# Patient Record
Sex: Female | Born: 1960 | Race: White | Hispanic: No | Marital: Single | Smoking: Never smoker
Health system: Southern US, Community
[De-identification: ages and names within clinical notes are randomized; demographics above are authoritative.]

## PROBLEM LIST (undated history)

## (undated) DIAGNOSIS — B019 Varicella without complication: Secondary | ICD-10-CM

## (undated) DIAGNOSIS — K219 Gastro-esophageal reflux disease without esophagitis: Secondary | ICD-10-CM

## (undated) DIAGNOSIS — Z87442 Personal history of urinary calculi: Secondary | ICD-10-CM

## (undated) DIAGNOSIS — N200 Calculus of kidney: Secondary | ICD-10-CM

## (undated) HISTORY — DX: Gastro-esophageal reflux disease without esophagitis: K21.9

## (undated) HISTORY — DX: Calculus of kidney: N20.0

## (undated) HISTORY — DX: Varicella without complication: B01.9

## (undated) HISTORY — DX: Personal history of urinary calculi: Z87.442

---

## 1999-07-27 ENCOUNTER — Other Ambulatory Visit: Admission: RE | Admit: 1999-07-27 | Discharge: 1999-07-27 | Payer: Self-pay | Admitting: *Deleted

## 2000-08-24 ENCOUNTER — Other Ambulatory Visit: Admission: RE | Admit: 2000-08-24 | Discharge: 2000-08-24 | Payer: Self-pay | Admitting: Family Medicine

## 2000-11-14 ENCOUNTER — Encounter: Admission: RE | Admit: 2000-11-14 | Discharge: 2000-11-14 | Payer: Self-pay | Admitting: Family Medicine

## 2000-11-14 ENCOUNTER — Encounter: Payer: Self-pay | Admitting: Family Medicine

## 2001-04-15 ENCOUNTER — Encounter: Payer: Self-pay | Admitting: Gastroenterology

## 2001-04-15 ENCOUNTER — Encounter: Admission: RE | Admit: 2001-04-15 | Discharge: 2001-04-15 | Payer: Self-pay | Admitting: Gastroenterology

## 2001-07-16 ENCOUNTER — Other Ambulatory Visit: Admission: RE | Admit: 2001-07-16 | Discharge: 2001-07-16 | Payer: Self-pay | Admitting: Family Medicine

## 2002-04-21 ENCOUNTER — Ambulatory Visit: Admission: RE | Admit: 2002-04-21 | Discharge: 2002-04-21 | Payer: Self-pay | Admitting: Family Medicine

## 2002-10-15 ENCOUNTER — Other Ambulatory Visit: Admission: RE | Admit: 2002-10-15 | Discharge: 2002-10-15 | Payer: Self-pay | Admitting: Family Medicine

## 2004-09-01 ENCOUNTER — Encounter: Admission: RE | Admit: 2004-09-01 | Discharge: 2004-09-01 | Payer: Self-pay | Admitting: Family Medicine

## 2004-12-23 ENCOUNTER — Encounter: Admission: RE | Admit: 2004-12-23 | Discharge: 2004-12-23 | Payer: Self-pay | Admitting: Family Medicine

## 2005-02-07 ENCOUNTER — Other Ambulatory Visit: Admission: RE | Admit: 2005-02-07 | Discharge: 2005-02-07 | Payer: Self-pay | Admitting: Obstetrics and Gynecology

## 2005-02-21 ENCOUNTER — Ambulatory Visit (HOSPITAL_COMMUNITY): Admission: RE | Admit: 2005-02-21 | Discharge: 2005-02-21 | Payer: Self-pay | Admitting: Obstetrics and Gynecology

## 2005-02-22 ENCOUNTER — Ambulatory Visit (HOSPITAL_COMMUNITY): Admission: RE | Admit: 2005-02-22 | Discharge: 2005-02-22 | Payer: Self-pay | Admitting: Obstetrics and Gynecology

## 2007-01-29 ENCOUNTER — Other Ambulatory Visit: Admission: RE | Admit: 2007-01-29 | Discharge: 2007-01-29 | Payer: Self-pay | Admitting: Obstetrics and Gynecology

## 2007-04-04 ENCOUNTER — Emergency Department (HOSPITAL_COMMUNITY): Admission: EM | Admit: 2007-04-04 | Discharge: 2007-04-04 | Payer: Self-pay | Admitting: Emergency Medicine

## 2007-11-06 ENCOUNTER — Encounter: Admission: RE | Admit: 2007-11-06 | Discharge: 2007-11-06 | Payer: Self-pay | Admitting: Obstetrics and Gynecology

## 2008-09-11 ENCOUNTER — Encounter: Admission: RE | Admit: 2008-09-11 | Discharge: 2008-09-11 | Payer: Self-pay | Admitting: Obstetrics and Gynecology

## 2008-12-25 HISTORY — PX: OTHER SURGICAL HISTORY: SHX169

## 2008-12-25 HISTORY — PX: ABDOMINAL HYSTERECTOMY: SHX81

## 2012-07-15 ENCOUNTER — Other Ambulatory Visit: Payer: Self-pay | Admitting: Family Medicine

## 2012-07-15 ENCOUNTER — Ambulatory Visit
Admission: RE | Admit: 2012-07-15 | Discharge: 2012-07-15 | Disposition: A | Discharge status: Home / Self Care | Payer: 59 | Source: Ambulatory Visit | Attending: Family Medicine | Admitting: Family Medicine

## 2012-07-15 DIAGNOSIS — R7611 Nonspecific reaction to tuberculin skin test without active tuberculosis: Secondary | ICD-10-CM

## 2013-01-02 ENCOUNTER — Other Ambulatory Visit: Payer: Self-pay | Admitting: Family Medicine

## 2013-01-02 DIAGNOSIS — Z1231 Encounter for screening mammogram for malignant neoplasm of breast: Secondary | ICD-10-CM

## 2013-02-04 ENCOUNTER — Ambulatory Visit
Admission: RE | Admit: 2013-02-04 | Discharge: 2013-02-04 | Disposition: A | Discharge status: Home / Self Care | Payer: BC Managed Care – PPO | Source: Ambulatory Visit | Attending: Family Medicine | Admitting: Family Medicine

## 2013-02-04 DIAGNOSIS — Z1231 Encounter for screening mammogram for malignant neoplasm of breast: Secondary | ICD-10-CM

## 2013-02-05 ENCOUNTER — Ambulatory Visit: Payer: 59

## 2014-02-27 ENCOUNTER — Ambulatory Visit (INDEPENDENT_AMBULATORY_CARE_PROVIDER_SITE_OTHER): Payer: BC Managed Care – PPO | Admitting: Internal Medicine

## 2014-02-27 ENCOUNTER — Encounter: Payer: Self-pay | Admitting: Internal Medicine

## 2014-02-27 VITALS — BP 110/76 | HR 60 | Temp 98.0°F | Ht 65.0 in | Wt 156.0 lb

## 2014-02-27 DIAGNOSIS — R635 Abnormal weight gain: Secondary | ICD-10-CM

## 2014-02-27 DIAGNOSIS — R5383 Other fatigue: Secondary | ICD-10-CM

## 2014-02-27 DIAGNOSIS — E663 Overweight: Secondary | ICD-10-CM

## 2014-02-27 DIAGNOSIS — R5381 Other malaise: Secondary | ICD-10-CM

## 2014-02-27 LAB — COMPREHENSIVE METABOLIC PANEL
ALK PHOS: 63 U/L (ref 39–117)
ALT: 21 U/L (ref 0–35)
AST: 26 U/L (ref 0–37)
Albumin: 4.4 g/dL (ref 3.5–5.2)
BILIRUBIN TOTAL: 0.7 mg/dL (ref 0.3–1.2)
BUN: 8 mg/dL (ref 6–23)
CHLORIDE: 104 meq/L (ref 96–112)
CO2: 28 mEq/L (ref 19–32)
Calcium: 10 mg/dL (ref 8.4–10.5)
Creatinine, Ser: 0.7 mg/dL (ref 0.4–1.2)
GFR: 93.5 mL/min (ref >60.00)
Glucose, Bld: 82 mg/dL (ref 70–99)
Potassium: 3.8 mEq/L (ref 3.5–5.1)
SODIUM: 141 meq/L (ref 135–145)
TOTAL PROTEIN: 7.6 g/dL (ref 6.0–8.3)

## 2014-02-27 LAB — CBC
HCT: 41.5 % (ref 36.0–46.0)
HEMOGLOBIN: 13.8 g/dL (ref 12.0–15.0)
MCHC: 33.3 g/dL (ref 30.0–36.0)
MCV: 88.8 fl (ref 78.0–100.0)
PLATELETS: 224 10*3/uL (ref 150.0–400.0)
RBC: 4.67 Mil/uL (ref 3.87–5.11)
RDW: 12.8 % (ref 11.5–14.6)
WBC: 5.9 10*3/uL (ref 4.5–10.5)

## 2014-02-27 LAB — LIPID PANEL
CHOL/HDL RATIO: 3
Cholesterol: 212 mg/dL — ABNORMAL HIGH (ref 0–200)
HDL: 62.5 mg/dL (ref >39.00)
LDL Cholesterol: 134 mg/dL — ABNORMAL HIGH (ref 0–99)
TRIGLYCERIDES: 80 mg/dL (ref 0.0–149.0)
VLDL: 16 mg/dL (ref 0.0–40.0)

## 2014-02-27 LAB — TSH: TSH: 0.93 u[IU]/mL (ref 0.35–5.50)

## 2014-02-27 NOTE — Assessment & Plan Note (Signed)
Will check thyroid If normal, will start phentermine

## 2014-02-27 NOTE — Assessment & Plan Note (Signed)
Labs obtained today

## 2014-02-27 NOTE — Progress Notes (Signed)
Pre visit review using our clinic review tool, if applicable. No additional management support is needed unless otherwise documented below in the visit note. 

## 2014-02-27 NOTE — Patient Instructions (Addendum)

## 2014-02-27 NOTE — Progress Notes (Signed)
HPI Pt presents to the clinic today to establish care. She is transferring care from [place]. She does have multiple concerns today.   Weight: gained 20 lb in last 2.5 years due to menopause. She does exercise (cardio)  multiple times per week. Her diet consist of steel oats with blueberries, salad for lunch, chicken and veggies.  She does c/o a throbbing pain in her left lower quadrant. She reports it started about 3-4 months. It does not hurt with intercourse. She is post menopausal. She does have a history of ovarian cysts. Her last pap smear was 11/2009. She has had a hysterectomy in 2010.  She does c/o pain in her left hips. She has noticed this within the last 2-3. The pain is worse with sitting. She has noticed some mild swelling in her left leg. She does take Aleve OTC if she is in severe pain. She denies any specific injury that she is aware. She does have some numbness or tingling in her left leg.  Past Medical History  Diagnosis Date  . Chicken pox   . GERD (gastroesophageal reflux disease)   . History of kidney stones     Current Outpatient Prescriptions  Medication Sig Dispense Refill  . Multiple Vitamin (MULTIVITAMIN) tablet Take 1 tablet by mouth daily.       No current facility-administered medications for this visit.    Allergies  Allergen Reactions  . Sulfa Antibiotics Nausea Only  . Penicillins Rash    Family History  Problem Relation Age of Onset  . Heart disease Father   . Heart disease Maternal Grandfather   . Bone cancer Paternal Grandmother     History   Social History  . Marital Status: Single    Spouse Name: N/A    Number of Children: N/A  . Years of Education: N/A   Occupational History  . Not on file.   Social History Main Topics  . Smoking status: Never Smoker   . Smokeless tobacco: Never Used  . Alcohol Use: Yes     Comment: occasional  . Drug Use: No  . Sexual Activity: Not on file   Other Topics Concern  . Not on file   Social  History Narrative  . No narrative on file    ROS:  Constitutional: Pt reports fatigue and weight gain. Denies fever, malaise, headache.  Respiratory: Denies difficulty breathing, shortness of breath, cough or sputum production.   Cardiovascular: Denies chest pain, chest tightness, palpitations or swelling in the hands or feet.  Gastrointestinal: Pt reports LLQ pain. Denies bloating, constipation, diarrhea or blood in the stool.  GU: Denies frequency, urgency, pain with urination, blood in urine, odor or discharge. Musculoskeletal: Pt reports left hip pain. Denies decrease in range of motion, difficulty with gait, muscle pain or joint swelling.    No other specific complaints in a complete review of systems (except as listed in HPI above).  PE:  BP 110/76  Pulse 60  Temp(Src) 98 F (36.7 C) (Oral)  Ht 5\' 5"  (1.651 m)  Wt 156 lb (70.761 kg)  BMI 25.96 kg/m2  SpO2 99% Wt Readings from Last 3 Encounters:  02/27/14 156 lb (70.761 kg)    General: Appears her stated age, well developed, well nourished in NAD. Cardiovascular: Normal rate and rhythm. S1,S2 noted.  No murmur, rubs or gallops noted. No JVD or BLE edema. No carotid bruits noted. Pulmonary/Chest: Normal effort and positive vesicular breath sounds. No respiratory distress. No wheezes, rales or ronchi noted.  Abdomen: Soft and tender in the LLQ (more pelvic region). Normal bowel sounds, no bruits noted. No distention or masses noted. Liver, spleen and kidneys non palpable. Musculoskeletal: Normal internal/external rotation of the left hip. No crepitus noted with ROM. Strength 5/5 BUE.    Assessment and Plan:  Left Hip pain:  Exam normal today She declines xray  She prefers to go to chiropractor for xray and treatment  LLQ pain:  ? Ovarian cyst Will perform pelvic at your physical exam in 1 week Will likely need to obtain ultrasound  RTC in the next 2 weeks for your physical exam

## 2014-02-28 LAB — T3: T3, Total: 91.9 ng/dL (ref 80.0–204.0)

## 2014-02-28 LAB — T4: T4 TOTAL: 8 ug/dL (ref 5.0–12.5)

## 2014-03-03 ENCOUNTER — Ambulatory Visit (INDEPENDENT_AMBULATORY_CARE_PROVIDER_SITE_OTHER): Payer: BC Managed Care – PPO | Admitting: Internal Medicine

## 2014-03-03 ENCOUNTER — Encounter: Payer: Self-pay | Admitting: Internal Medicine

## 2014-03-03 VITALS — BP 118/78 | HR 74 | Temp 98.4°F | Ht 65.0 in | Wt 156.0 lb

## 2014-03-03 DIAGNOSIS — Z1211 Encounter for screening for malignant neoplasm of colon: Secondary | ICD-10-CM

## 2014-03-03 DIAGNOSIS — E039 Hypothyroidism, unspecified: Secondary | ICD-10-CM | POA: Insufficient documentation

## 2014-03-03 DIAGNOSIS — R102 Pelvic and perineal pain: Secondary | ICD-10-CM

## 2014-03-03 DIAGNOSIS — E038 Other specified hypothyroidism: Secondary | ICD-10-CM

## 2014-03-03 DIAGNOSIS — Z0000 Encounter for general adult medical examination without abnormal findings: Secondary | ICD-10-CM

## 2014-03-03 MED ORDER — LEVOTHYROXINE SODIUM 25 MCG PO TABS: 25.0000 ug | ORAL_TABLET | Freq: Every day | ORAL | Status: DC

## 2014-03-03 NOTE — Addendum Note (Signed)
Addended by: Alvina ChouWALSH, Azie Mcconahy J on: 03/03/2014 10:49 AM   Modules accepted: Orders

## 2014-03-03 NOTE — Progress Notes (Signed)
Pre visit review using our clinic review tool, if applicable. No additional management support is needed unless otherwise documented below in the visit note. 

## 2014-03-03 NOTE — Progress Notes (Signed)
Subjective:    Patient ID: Joanna Peterson, female    DOB: July 03, 1961, 53 y.o.   MRN: [id number]  HPI  Pt presents to the clinic today for her annual exam. She does have concerns about her weight. We tested her thyroid at her last visit which was normal. She has gained 20 lb in last 2.5 years due to menopause. She does exercise (cardio)  multiple times per week. Her diet consist of steel oats with blueberries, salad for lunch, chicken and veggies.  She does c/o a throbbing pain in her left lower quadrant. She reports it started about 3-4 months. It does not hurt with intercourse. She is post menopausal. She does have a history of ovarian cysts. Her last pap smear was 11/2009. She has had a hysterectomy in 2010.  Flu: never Tetanus: within the last 5 years Pap Smear: 2010 Mammogram: 2014 Colon Screening: never Eye Doctor: yearly Dentist: biannually   Review of Systems      Past Medical History  Diagnosis Date  . Chicken pox   . GERD (gastroesophageal reflux disease)   . History of kidney stones     Current Outpatient Prescriptions  Medication Sig Dispense Refill  . Multiple Vitamin (MULTIVITAMIN) tablet Take 1 tablet by mouth daily.       No current facility-administered medications for this visit.    Allergies  Allergen Reactions  . Sulfa Antibiotics Nausea Only  . Penicillins Rash    Family History  Problem Relation Age of Onset  . Heart disease Father   . Heart disease Maternal Grandfather   . Bone cancer Paternal Grandmother     History   Social History  . Marital Status: Single    Spouse Name: N/A    Number of Children: N/A  . Years of Education: N/A   Occupational History  . Not on file.   Social History Main Topics  . Smoking status: Never Smoker   . Smokeless tobacco: Never Used  . Alcohol Use: Yes     Comment: occasional  . Drug Use: No  . Sexual Activity: Yes   Other Topics Concern  . Not on file   Social History Narrative  . No  narrative on file     Constitutional: Pt reports weight gain and fatigue. Denies fever, malaise, headache.  HEENT: Denies eye pain, eye redness, ear pain, ringing in the ears, wax buildup, runny nose, nasal congestion, bloody nose, or sore throat. Respiratory: Denies difficulty breathing, shortness of breath, cough or sputum production.   Cardiovascular: Denies chest pain, chest tightness, palpitations or swelling in the hands or feet.  Gastrointestinal: Pt reports left lower quadrant pain. Denies abdominal pain, bloating, constipation, diarrhea or blood in the stool.  GU: Denies urgency, frequency, pain with urination, burning sensation, blood in urine, odor or discharge. Musculoskeletal: Denies decrease in range of motion, difficulty with gait, muscle pain or joint pain and swelling.  Skin: Denies redness, rashes, lesions or ulcercations.  Neurological: Denies dizziness, difficulty with memory, difficulty with speech or problems with balance and coordination.   No other specific complaints in a complete review of systems (except as listed in HPI above).  Objective:   Physical Exam  BP 118/78  Pulse 74  Temp(Src) 98.4 F (36.9 C) (Oral)  Ht 5\' 5"  (1.651 m)  Wt 156 lb (70.761 kg)  BMI 25.96 kg/m2  SpO2 98%  Constitutional:  Alert, oriented x 4, well developed, well nourished in no apparent distress. Skin: Skin is warm and  dry.  No erythema, lesion or ulceration noted. HEENT: Head: normal shape and size; Eyes: sclera white, no icterus, conjunctiva pink, PERRLA and EOMs intact; Ears: Tm's gray and intact, normal light reflex; Nose: mucosa pink and moist, septum midline; Throat/Mouth: Teeth present, , mucosa pink and moist, no lesions or ulcerations noted. Neck: Normal range of motion. Neck supple, trachea midline. No massses, lumps or thyromegaly present.  Cardiovascular: Normal rate and rhythm. S1,S2 noted.  No murmur, rubs or gallops noted. No JVD or BLE edema. No carotid bruits  noted. Pulmonary/Chest: Normal effort and positive vesicular breath sounds. No respiratory distress. No wheezes, rales or ronchi noted.  Abdomen: Soft and nontender. Normal bowel sounds, no bruits noted. No distention or masses noted. Liver, spleen and kidneys non palpable. Genitourinary: Normal female anatomy. Adenexa palpable on the left. Breast without lumps or masses.  Musculoskeletal: Normal range of motion. Patient exhibits no effusions.  Neurological: Alert and oriented. Cranial nerves II-XII intact. Coordination normal. +DTRs bilaterally. Psychiatric: She has a normal mood and affect. Behavior is normal. Judgment and thought content normal.       Assessment & Plan:   Preventative Health Care:  She has her mammogram scheduled in April She declines flu Her Tetanus is UTD We will do IFOB for colon screening Pelvic exam today Labs reviewed- normal

## 2014-03-03 NOTE — Assessment & Plan Note (Signed)
Levels are on low side of normal Will start synthroid 25 mcg daily  RTC in 4 weeks to recheck TSH

## 2014-03-03 NOTE — Patient Instructions (Addendum)

## 2014-03-03 NOTE — Assessment & Plan Note (Signed)
Will order ultrasound to r/o left ovarian cyst

## 2014-03-05 ENCOUNTER — Ambulatory Visit
Admission: RE | Admit: 2014-03-05 | Discharge: 2014-03-05 | Disposition: A | Discharge status: Home / Self Care | Payer: BC Managed Care – PPO | Source: Ambulatory Visit | Attending: Internal Medicine | Admitting: Internal Medicine

## 2014-03-05 ENCOUNTER — Other Ambulatory Visit (INDEPENDENT_AMBULATORY_CARE_PROVIDER_SITE_OTHER): Payer: BC Managed Care – PPO

## 2014-03-05 DIAGNOSIS — R102 Pelvic and perineal pain: Secondary | ICD-10-CM

## 2014-03-05 DIAGNOSIS — Z1211 Encounter for screening for malignant neoplasm of colon: Secondary | ICD-10-CM

## 2014-03-05 LAB — FECAL OCCULT BLOOD, IMMUNOCHEMICAL: FECAL OCCULT BLD: NEGATIVE

## 2014-04-01 ENCOUNTER — Other Ambulatory Visit (INDEPENDENT_AMBULATORY_CARE_PROVIDER_SITE_OTHER): Payer: BC Managed Care – PPO

## 2014-04-01 ENCOUNTER — Other Ambulatory Visit: Payer: Self-pay | Admitting: Internal Medicine

## 2014-04-01 DIAGNOSIS — E039 Hypothyroidism, unspecified: Secondary | ICD-10-CM

## 2014-04-01 DIAGNOSIS — E038 Other specified hypothyroidism: Secondary | ICD-10-CM

## 2014-04-01 LAB — TSH: TSH: 1.23 u[IU]/mL (ref 0.35–5.50)

## 2014-04-03 MED ORDER — LEVOTHYROXINE SODIUM 25 MCG PO TABS: 25.0000 ug | ORAL_TABLET | Freq: Every day | ORAL | Status: DC

## 2014-04-03 NOTE — Addendum Note (Signed)
Addended by: Roena MaladyEVONTENNO, Shirely Toren Y on: 04/03/2014 11:53 AM   Modules accepted: Orders

## 2015-04-05 ENCOUNTER — Other Ambulatory Visit: Payer: Self-pay | Admitting: Chiropractic Medicine

## 2015-04-05 ENCOUNTER — Ambulatory Visit
Admission: RE | Admit: 2015-04-05 | Discharge: 2015-04-05 | Disposition: A | Discharge status: Home / Self Care | Payer: BLUE CROSS/BLUE SHIELD | Source: Ambulatory Visit | Attending: Chiropractic Medicine | Admitting: Chiropractic Medicine

## 2015-04-05 DIAGNOSIS — M25552 Pain in left hip: Secondary | ICD-10-CM

## 2015-05-06 ENCOUNTER — Other Ambulatory Visit: Payer: Self-pay

## 2015-05-06 DIAGNOSIS — Z1231 Encounter for screening mammogram for malignant neoplasm of breast: Secondary | ICD-10-CM

## 2015-06-02 ENCOUNTER — Ambulatory Visit: Payer: BLUE CROSS/BLUE SHIELD

## 2015-08-12 ENCOUNTER — Other Ambulatory Visit: Payer: Self-pay | Admitting: Internal Medicine

## 2015-08-12 DIAGNOSIS — Z0000 Encounter for general adult medical examination without abnormal findings: Secondary | ICD-10-CM

## 2015-08-12 MED ORDER — AZITHROMYCIN 250 MG PO TABS: ORAL_TABLET | ORAL | Status: DC

## 2015-08-17 ENCOUNTER — Other Ambulatory Visit (INDEPENDENT_AMBULATORY_CARE_PROVIDER_SITE_OTHER): Payer: 59

## 2015-08-17 DIAGNOSIS — Z0000 Encounter for general adult medical examination without abnormal findings: Secondary | ICD-10-CM

## 2015-08-17 LAB — CBC
HCT: 40.7 % (ref 36.0–46.0)
Hemoglobin: 13.8 g/dL (ref 12.0–15.0)
MCHC: 34 g/dL (ref 30.0–36.0)
MCV: 87.9 fl (ref 78.0–100.0)
PLATELETS: 223 10*3/uL (ref 150.0–400.0)
RBC: 4.63 Mil/uL (ref 3.87–5.11)
RDW: 12.6 % (ref 11.5–15.5)
WBC: 5.3 10*3/uL (ref 4.0–10.5)

## 2015-08-17 LAB — COMPREHENSIVE METABOLIC PANEL
ALT: 14 U/L (ref 0–35)
AST: 18 U/L (ref 0–37)
Albumin: 4.1 g/dL (ref 3.5–5.2)
Alkaline Phosphatase: 55 U/L (ref 39–117)
BUN: 15 mg/dL (ref 6–23)
CALCIUM: 9.5 mg/dL (ref 8.4–10.5)
CHLORIDE: 103 meq/L (ref 96–112)
CO2: 31 meq/L (ref 19–32)
CREATININE: 0.68 mg/dL (ref 0.40–1.20)
GFR: 96.14 mL/min (ref >60.00)
Glucose, Bld: 94 mg/dL (ref 70–99)
Potassium: 4.1 mEq/L (ref 3.5–5.1)
SODIUM: 141 meq/L (ref 135–145)
Total Bilirubin: 0.5 mg/dL (ref 0.2–1.2)
Total Protein: 7 g/dL (ref 6.0–8.3)

## 2015-08-17 LAB — LIPID PANEL
Cholesterol: 221 mg/dL — ABNORMAL HIGH (ref 0–200)
HDL: 66.5 mg/dL (ref >39.00)
LDL Cholesterol: 135 mg/dL — ABNORMAL HIGH (ref 0–99)
NONHDL: 154.24
Total CHOL/HDL Ratio: 3
Triglycerides: 95 mg/dL (ref 0.0–149.0)
VLDL: 19 mg/dL (ref 0.0–40.0)

## 2015-08-17 LAB — T4, FREE: FREE T4: 0.8 ng/dL (ref 0.60–1.60)

## 2015-08-17 LAB — TSH: TSH: 1.97 u[IU]/mL (ref 0.35–4.50)

## 2015-08-23 ENCOUNTER — Encounter: Payer: Self-pay | Admitting: Internal Medicine

## 2015-08-23 ENCOUNTER — Ambulatory Visit (INDEPENDENT_AMBULATORY_CARE_PROVIDER_SITE_OTHER): Payer: 59 | Admitting: Internal Medicine

## 2015-08-23 VITALS — BP 106/70 | HR 68 | Temp 97.9°F | Ht 65.0 in | Wt 153.0 lb

## 2015-08-23 DIAGNOSIS — Z0000 Encounter for general adult medical examination without abnormal findings: Secondary | ICD-10-CM

## 2015-08-23 NOTE — Patient Instructions (Signed)

## 2015-08-23 NOTE — Progress Notes (Signed)
Pre visit review using our clinic review tool, if applicable. No additional management support is needed unless otherwise documented below in the visit note. 

## 2015-08-23 NOTE — Progress Notes (Signed)
Subjective:    Patient ID: Joanna Peterson, female    DOB: 13-Aug-1961, 54 y.o.   MRN: [id number]  HPI  Pt presents to the clinic today for her annual exam.  Flu: 10/ Tetanus: 2007 Pap Smear: Hysterectomy (6-7 years ago). Pelvic done 2015. Mammogram: 2014. [facility] Colon Screening: never, did do IFOB yearly Vision Screening: 04/2015- yearly Dentist: biannually  Diet: She eats a balanced diet. She consumes fruits and veggies daily. Exercise: She does bootcamp, Pilates, 3-4 days per week.  Review of Systems      Past Medical History  Diagnosis Date  . Chicken pox   . GERD (gastroesophageal reflux disease)   . History of kidney stones     Current Outpatient Prescriptions  Medication Sig Dispense Refill  . levothyroxine (SYNTHROID, LEVOTHROID) 25 MCG tablet Take 1 tablet (25 mcg total) by mouth daily before breakfast. 30 tablet 5  . Multiple Vitamin (MULTIVITAMIN) tablet Take 1 tablet by mouth daily.     No current facility-administered medications for this visit.    Allergies  Allergen Reactions  . Sulfa Antibiotics Nausea Only  . Penicillins Rash    Family History  Problem Relation Age of Onset  . Heart disease Father   . Heart disease Maternal Grandfather   . Bone cancer Paternal Grandmother     Social History   Social History  . Marital Status: Single    Spouse Name: N/A  . Number of Children: N/A  . Years of Education: N/A   Occupational History  . Not on file.   Social History Main Topics  . Smoking status: Never Smoker   . Smokeless tobacco: Never Used  . Alcohol Use: Yes     Comment: occasional  . Drug Use: No  . Sexual Activity: Yes   Other Topics Concern  . Not on file   Social History Narrative     Constitutional: Denies fever, malaise, fatigue, headache or abrupt weight changes.  HEENT: Denies eye pain, eye redness, ear pain, ringing in the ears, wax buildup, runny nose, nasal  congestion, bloody nose, or sore throat. Respiratory: Denies difficulty breathing, shortness of breath, cough or sputum production.   Cardiovascular: Denies chest pain, chest tightness, palpitations or swelling in the hands or feet.  Gastrointestinal: Denies abdominal pain, bloating, constipation, diarrhea or blood in the stool.  GU: Denies urgency, frequency, pain with urination, burning sensation, blood in urine, odor or discharge. Musculoskeletal: Denies decrease in range of motion, difficulty with gait, muscle pain or joint pain and swelling.  Skin: Pt reports a mole on the left side of her neck. Denies redness, rashes, or ulcercations.  Neurological: Denies dizziness, difficulty with memory, difficulty with speech or problems with balance and coordination.  Psych: Denies anxiety, depression, SI/HI.  No other specific complaints in a complete review of systems (except as listed in HPI above).  Objective:   Physical Exam  BP 106/70 mmHg  Pulse 68  Temp(Src) 97.9 F (36.6 C) (Oral)  Ht  (1.651 m)  Wt 153 lb (69.4 kg)  BMI 25.46 kg/m2  SpO2 98% Wt Readings from Last 3 Encounters:  08/23/15 153 lb (69.4 kg)  03/03/14 156 lb (70.761 kg)  02/27/14 156 lb (70.761 kg)    General: Appears her stated age, well developed, well nourished in NAD. Skin: Warm, dry and intact. She has some small nevi on her neck. She has a seborrheic keratosis on the left  side of her neck. HEENT: Head: normal shape and size; Eyes: sclera white, no icterus, conjunctiva pink, PERRLA and EOMs intact; Ears: Tm's gray and intact, normal light reflex; Throat/Mouth: Teeth present, mucosa pink and moist, no exudate, lesions or ulcerations noted.  Neck: Neck supple, trachea midline. No masses, lumps or thyromegaly present.  Cardiovascular: Normal rate and rhythm. S1,S2 noted.  No murmur, rubs or gallops noted. No JVD or BLE edema. No carotid bruits noted. Pulmonary/Chest: Normal effort and positive vesicular breath  sounds. No respiratory distress. No wheezes, rales or ronchi noted.  Abdomen: Soft and nontender. Normal bowel sounds, no bruits noted. No distention or masses noted. Liver, spleen and kidneys non palpable. Musculoskeletal: Normal range of motion. Strength 5/5 BUE/BLE. No difficulty with gait.  Neurological: Alert and oriented. Cranial nerves II-XII grossly intact. Coordination normal.  Psychiatric: Mood and affect normal. Behavior is normal. Judgment and thought content normal.     BMET    Component Value Date/Time   NA 141 08/17/2015 0930   K 4.1 08/17/2015 0930   CL 103 08/17/2015 0930   CO2 31 08/17/2015 0930   GLUCOSE 94 08/17/2015 0930   BUN 15 08/17/2015 0930   CREATININE 0.68 08/17/2015 0930   CALCIUM 9.5 08/17/2015 0930    Lipid Panel     Component Value Date/Time   CHOL 221* 08/17/2015 0930   TRIG 95.0 08/17/2015 0930   HDL 66.50 08/17/2015 0930   CHOLHDL 3 08/17/2015 0930   VLDL 19.0 08/17/2015 0930   LDLCALC 135* 08/17/2015 0930    CBC    Component Value Date/Time   WBC 5.3 08/17/2015 0930   RBC 4.63 08/17/2015 0930   HGB 13.8 08/17/2015 0930   HCT 40.7 08/17/2015 0930   PLT 223.0 08/17/2015 0930   MCV 87.9 08/17/2015 0930   MCHC 34.0 08/17/2015 0930   RDW 12.6 08/17/2015 0930    Hgb A1C No results found for: HGBA1C       Assessment & Plan:   Preventative Health Maintenance:  Advised her to get a flu shot in the fall 2016 Tetanus UTD [facility] Pelvic exam due 2020 She declines colonoscopy but is agreeable to IFOB Encouraged her to continue seeing an eye doctor and dentist at least yearly  RTC in 1 year or sooner if needed RTC in 1 year or sooner if needed

## 2015-09-27 ENCOUNTER — Encounter: Payer: Self-pay | Admitting: Internal Medicine

## 2015-09-27 ENCOUNTER — Ambulatory Visit (INDEPENDENT_AMBULATORY_CARE_PROVIDER_SITE_OTHER): Payer: 59 | Admitting: Internal Medicine

## 2015-09-27 VITALS — BP 124/76 | HR 63 | Temp 98.2°F

## 2015-09-27 DIAGNOSIS — R41840 Attention and concentration deficit: Secondary | ICD-10-CM | POA: Diagnosis not present

## 2015-09-27 DIAGNOSIS — T148XXA Other injury of unspecified body region, initial encounter: Secondary | ICD-10-CM

## 2015-09-27 DIAGNOSIS — T148 Other injury of unspecified body region: Secondary | ICD-10-CM

## 2015-09-27 MED ORDER — CYCLOBENZAPRINE HCL 5 MG PO TABS: 5.0000 mg | ORAL_TABLET | Freq: Every day | ORAL | Status: DC

## 2015-09-27 NOTE — Patient Instructions (Signed)
Back Exercises These exercises may help you when beginning to rehabilitate your injury. Your symptoms may resolve with or without further involvement from your physician, physical therapist or athletic trainer. While completing these exercises, remember:   Restoring tissue flexibility helps normal motion to return to the joints. This allows healthier, less painful movement and activity.  An effective stretch should be held for at least 30 seconds.  A stretch should never be painful. You should only feel a gentle lengthening or release in the stretched tissue. STRETCH - Extension, Prone on Elbows   Lie on your stomach on the floor, a bed will be too soft. Place your palms about shoulder width apart and at the height of your head.  Place your elbows under your shoulders. If this is too painful, stack pillows under your chest.  Allow your body to relax so that your hips drop lower and make contact more completely with the floor.  Hold this position for __________ seconds.  Slowly return to lying flat on the floor. Repeat __________ times. Complete this exercise __________ times per day.  RANGE OF MOTION - Extension, Prone Press Ups   Lie on your stomach on the floor, a bed will be too soft. Place your palms about shoulder width apart and at the height of your head.  Keeping your back as relaxed as possible, slowly straighten your elbows while keeping your hips on the floor. You may adjust the placement of your hands to maximize your comfort. As you gain motion, your hands will come more underneath your shoulders.  Hold this position __________ seconds.  Slowly return to lying flat on the floor. Repeat __________ times. Complete this exercise __________ times per day.  RANGE OF MOTION- Quadruped, Neutral Spine   Assume a hands and knees position on a firm surface. Keep your hands under your shoulders and your knees under your hips. You may place padding under your knees for  comfort.  Drop your head and point your tail bone toward the ground below you. This will round out your low back like an angry cat. Hold this position for __________ seconds.  Slowly lift your head and release your tail bone so that your back sags into a large arch, like an old horse.  Hold this position for __________ seconds.  Repeat this until you feel limber in your low back.  Now, find your "sweet spot." This will be the most comfortable position somewhere between the two previous positions. This is your neutral spine. Once you have found this position, tense your stomach muscles to support your low back.  Hold this position for __________ seconds. Repeat __________ times. Complete this exercise __________ times per day.  STRETCH - Flexion, Single Knee to Chest   Lie on a firm bed or floor with both legs extended in front of you.  Keeping one leg in contact with the floor, bring your opposite knee to your chest. Hold your leg in place by either grabbing behind your thigh or at your knee.  Pull until you feel a gentle stretch in your low back. Hold __________ seconds.  Slowly release your grasp and repeat the exercise with the opposite side. Repeat __________ times. Complete this exercise __________ times per day.  STRETCH - Hamstrings, Standing  Stand or sit and extend your right / left leg, placing your foot on a chair or foot stool  Keeping a slight arch in your low back and your hips straight forward.  Lead with your chest and   lean forward at the waist until you feel a gentle stretch in the back of your right / left knee or thigh. (When done correctly, this exercise requires leaning only a small distance.)  Hold this position for __________ seconds. Repeat __________ times. Complete this stretch __________ times per day. STRENGTHENING - Deep Abdominals, Pelvic Tilt   Lie on a firm bed or floor. Keeping your legs in front of you, bend your knees so they are both pointed  toward the ceiling and your feet are flat on the floor.  Tense your lower abdominal muscles to press your low back into the floor. This motion will rotate your pelvis so that your tail bone is scooping upwards rather than pointing at your feet or into the floor.  With a gentle tension and even breathing, hold this position for __________ seconds. Repeat __________ times. Complete this exercise __________ times per day.  STRENGTHENING - Abdominals, Crunches   Lie on a firm bed or floor. Keeping your legs in front of you, bend your knees so they are both pointed toward the ceiling and your feet are flat on the floor. Cross your arms over your chest.  Slightly tip your chin down without bending your neck.  Tense your abdominals and slowly lift your trunk high enough to just clear your shoulder blades. Lifting higher can put excessive stress on the low back and does not further strengthen your abdominal muscles.  Control your return to the starting position. Repeat __________ times. Complete this exercise __________ times per day.  STRENGTHENING - Quadruped, Opposite UE/LE Lift   Assume a hands and knees position on a firm surface. Keep your hands under your shoulders and your knees under your hips. You may place padding under your knees for comfort.  Find your neutral spine and gently tense your abdominal muscles so that you can maintain this position. Your shoulders and hips should form a rectangle that is parallel with the floor and is not twisted.  Keeping your trunk steady, lift your right hand no higher than your shoulder and then your left leg no higher than your hip. Make sure you are not holding your breath. Hold this position __________ seconds.  Continuing to keep your abdominal muscles tense and your back steady, slowly return to your starting position. Repeat with the opposite arm and leg. Repeat __________ times. Complete this exercise __________ times per day. Document Released:  12/29/2005 Document Revised: 03/04/2012 Document Reviewed: 03/25/2009 [employer] Patient Information 2015 [employer]. This information is not intended to replace advice given to you by your health care provider. Make sure you discuss any questions you have with your health care provider.  

## 2015-09-27 NOTE — Progress Notes (Signed)
Pre visit review using our clinic review tool, if applicable. No additional management support is needed unless otherwise documented below in the visit note. 

## 2015-09-27 NOTE — Progress Notes (Signed)
Subjective:    Patient ID: Joanna Peterson, female    DOB: [birth year], 54 y.o.   MRN: [id number]  HPI  Pt presents to the clinic today with c/o left sided low back pain. This started 1 week ago but seemed to get worse yesterday. She describes the pain as throbbing. She also reports it is sore to touch. The pain radiates to her mid back. She denies any injury to the area. She denies numbness and tingling down her legs. She has tried Aleve, Thermacare patch and a muscle relaxer without much relief.  She also wants to discuss inattention. She has been dealing with this all of her life but feel like it is getting worse. She feels like it is interfering with her ability to do her job. She denies anxiety. She has never been tested or treated for ADD.  Review of Systems      Past Medical History  Diagnosis Date  . Chicken pox   . GERD (gastroesophageal reflux disease)   . History of kidney stones     Current Outpatient Prescriptions  Medication Sig Dispense Refill  . Multiple Vitamin (MULTIVITAMIN) tablet Take 1 tablet by mouth daily.     No current facility-administered medications for this visit.    Allergies  Allergen Reactions  . Sulfa Antibiotics Nausea Only  . Penicillins Rash    Family History  Problem Relation Age of Onset  . Heart disease Father   . Heart disease Maternal Grandfather   . Bone cancer Paternal Grandmother     Social History   Social History  . Marital Status: Single    Spouse Name: N/A  . Number of Children: N/A  . Years of Education: N/A   Occupational History  . Not on file.   Social History Main Topics  . Smoking status: Never Smoker   . Smokeless tobacco: Never Used  . Alcohol Use: 0.0 oz/week    0 Standard drinks or equivalent per week     Comment: occasional  . Drug Use: No  . Sexual Activity: Yes   Other Topics Concern  . Not on file   Social History Narrative     Constitutional: Denies fever, malaise, fatigue, headache or  abrupt weight changes.  Respiratory: Denies difficulty breathing, shortness of breath, cough or sputum production.   Cardiovascular: Denies chest pain, chest tightness, palpitations or swelling in the hands or feet.  Gastrointestinal: Denies abdominal pain, bloating, constipation, diarrhea or blood in the stool.  GU: Denies urgency, frequency, pain with urination, burning sensation, blood in urine, odor or discharge. Musculoskeletal: Pt reports back pain. Denies decrease in range of motion, difficulty with gait, or joint pain and swelling.  Neurological: Pt reports inattention. Denies difficulty with memory.  No other specific complaints in a complete review of systems (except as listed in HPI above).  Objective:   Physical Exam   BP 124/76 mmHg  Pulse 63  Temp(Src) 98.2 F (36.8 C) (Oral)  Wt   SpO2 98% Wt Readings from Last 3 Encounters:  08/23/15 153 lb (69.4 kg)  03/03/14 156 lb (70.761 kg)  02/27/14 156 lb (70.761 kg)    General: Appears her stated age, well developed, well nourished in NAD. Cardiovascular: Normal rate and rhythm. S1,S2 noted.  No murmur, rubs or gallops noted.  Pulmonary/Chest: Normal effort and positive vesicular breath sounds. No respiratory distress. No wheezes, rales or ronchi noted.  Abdomen: Soft and nontender. Normal bowel sounds, no bruits noted. No distention or masses noted.  Liver, spleen and kidneys non palpable. Musculoskeletal: Normal flexion, extension and rotation of the spine. No bony tenderness noted. Pain with palpation of the left para lumbar muscles. No difficulty with gait.  Neurological: Alert and oriented.  Psychiatric: Mood and affect normal. Behavior is normal. Judgment and thought content normal.     BMET    Component Value Date/Time   NA 141 08/17/2015 0930   K 4.1 08/17/2015 0930   CL 103 08/17/2015 0930   CO2 31 08/17/2015 0930   GLUCOSE 94 08/17/2015 0930   BUN 15 08/17/2015 0930   CREATININE 0.68 08/17/2015 0930    CALCIUM 9.5 08/17/2015 0930    Lipid Panel     Component Value Date/Time   CHOL 221* 08/17/2015 0930   TRIG 95.0 08/17/2015 0930   HDL 66.50 08/17/2015 0930   CHOLHDL 3 08/17/2015 0930   VLDL 19.0 08/17/2015 0930   LDLCALC 135* 08/17/2015 0930    CBC    Component Value Date/Time   WBC 5.3 08/17/2015 0930   RBC 4.63 08/17/2015 0930   HGB 13.8 08/17/2015 0930   HCT 40.7 08/17/2015 0930   PLT 223.0 08/17/2015 0930   MCV 87.9 08/17/2015 0930   MCHC 34.0 08/17/2015 0930   RDW 12.6 08/17/2015 0930    Hgb A1C No results found for: HGBA1C      Assessment & Plan:   Muscle strain back:  Continue Aleve eRx for Flexeril 5 mg QHS prn Stretcing exercises given Reassurance given that this should resolve with time  Inattention:  Referral placed to Psych for ADD testing Will discuss treatment after your evaluation  RTC as needed or if symptoms persist or worsen

## 2015-11-01 ENCOUNTER — Ambulatory Visit
Admission: RE | Admit: 2015-11-01 | Discharge: 2015-11-01 | Disposition: A | Discharge status: Home / Self Care | Payer: 59 | Source: Ambulatory Visit

## 2015-11-01 DIAGNOSIS — Z1231 Encounter for screening mammogram for malignant neoplasm of breast: Secondary | ICD-10-CM

## 2016-12-26 ENCOUNTER — Ambulatory Visit (INDEPENDENT_AMBULATORY_CARE_PROVIDER_SITE_OTHER): Payer: 59 | Admitting: Internal Medicine

## 2016-12-26 ENCOUNTER — Encounter: Payer: Self-pay | Admitting: Internal Medicine

## 2016-12-26 VITALS — BP 118/80 | HR 65 | Temp 98.2°F | Wt 152.0 lb

## 2016-12-26 DIAGNOSIS — K5901 Slow transit constipation: Secondary | ICD-10-CM

## 2016-12-26 NOTE — Patient Instructions (Signed)
Constipation, Adult °Constipation is when a person: °· Poops (has a bowel movement) fewer times in a week than normal. °· Has a hard time pooping. °· Has poop that is dry, hard, or bigger than normal. ° °Follow these instructions at home: °Eating and drinking ° °· Eat foods that have a lot of fiber, such as: °? Fresh fruits and vegetables. °? Whole grains. °? Beans. °· Eat less of foods that are high in fat, low in fiber, or overly processed, such as: °? French fries. °? Hamburgers. °? Cookies. °? Candy. °? Soda. °· Drink enough fluid to keep your pee (urine) clear or pale yellow. °General instructions °· Exercise regularly or as told by your doctor. °· Go to the restroom when you feel like you need to poop. Do not hold it in. °· Take over-the-counter and prescription medicines only as told by your doctor. These include any fiber supplements. °· Do pelvic floor retraining exercises, such as: °? Doing deep breathing while relaxing your lower belly (abdomen). °? Relaxing your pelvic floor while pooping. °· Watch your condition for any changes. °· Keep all follow-up visits as told by your doctor. This is important. °Contact a doctor if: °· You have pain that gets worse. °· You have a fever. °· You have not pooped for 4 days. °· You throw up (vomit). °· You are not hungry. °· You lose weight. °· You are bleeding from the anus. °· You have thin, pencil-like poop (stool). °Get help right away if: °· You have a fever, and your symptoms suddenly get worse. °· You leak poop or have blood in your poop. °· Your belly feels hard or bigger than normal (is bloated). °· You have very bad belly pain. °· You feel dizzy or you faint. °This information is not intended to replace advice given to you by your health care provider. Make sure you discuss any questions you have with your health care provider. °Document Released: 05/29/2008 Document Revised: 06/30/2016 Document Reviewed: 05/31/2016 °Elsevier Interactive Patient Education ©  2017 [employer] ° °

## 2016-12-26 NOTE — Progress Notes (Signed)
Subjective:    Patient ID: Joanna Peterson, female    DOB: April 08, 1961, 56 y.o.   MRN: [id number]  HPI  Pt presents to the clinic today with c/o constipation. This started 4 months ago, and has been intermittent during that time. She reports she has a BM 4 x week. She denies nausea, vomiting or blood in her stool, but has noticed some LLQ pain. She describes the pain as tender or achy. She has tried a laxative x 2 with good relief. She reports her water intake has decreased. She has had changes in her diet (not good changes) and increase in stress over the last 4 months. She has never had a colonoscopy and is concerned that she may have colon cancer.  Review of Systems      Past Medical History:  Diagnosis Date  . Chicken pox   . GERD (gastroesophageal reflux disease)   . History of kidney stones     Current Outpatient Prescriptions  Medication Sig Dispense Refill  . cyanocobalamin 1000 MCG tablet Take 1,000 mcg by mouth daily.    . Inulin (FIBER CHOICE PO) Take 1 scoop by mouth every other day.    Marland Kitchen. MAGNESIUM CITRATE PO Take 1 tablet by mouth at bedtime.    . Multiple Vitamin (MULTIVITAMIN) tablet Take 1 tablet by mouth daily.     No current facility-administered medications for this visit.     Allergies  Allergen Reactions  . Sulfa Antibiotics Nausea Only  . Penicillins Rash    Family History  Problem Relation Age of Onset  . Heart disease Father   . Heart disease Maternal Grandfather   . Bone cancer Paternal Grandmother     Social History   Social History  . Marital status: Single    Spouse name: N/A  . Number of children: N/A  . Years of education: N/A   Occupational History  . Not on file.   Social History Main Topics  . Smoking status: Never Smoker  . Smokeless tobacco: Never Used  . Alcohol use 0.0 oz/week     Comment: occasional  . Drug use: No  . Sexual activity: Yes   Other Topics Concern  . Not on file   Social History Narrative  . No  narrative on file     Constitutional: Denies fever, malaise, fatigue, headache or abrupt weight changes.   Gastrointestinal: Pt reports constipation. Denies abdominal pain, bloating, diarrhea or blood in the stool.    No other specific complaints in a complete review of systems (except as listed in HPI above).  Objective:   Physical Exam  BP 118/80   Pulse 65   Temp 98.2 F (36.8 C) (Oral)   Wt 152 lb (68.9 kg)   SpO2 99%   BMI 25.29 kg/m   Wt Readings from Last 3 Encounters:  12/26/16 152 lb (68.9 kg)  08/23/15 153 lb (69.4 kg)  03/03/14 156 lb (70.8 kg)    General: Appears her stated age, well developed, well nourished in NAD. Abdomen: Soft and nontender. Normal bowel sounds. No distention or masses noted. Liver, spleen and kidneys non palpable.   BMET    Component Value Date/Time   NA 141 08/17/2015 0930   K 4.1 08/17/2015 0930   CL 103 08/17/2015 0930   CO2 31 08/17/2015 0930   GLUCOSE 94 08/17/2015 0930   BUN 15 08/17/2015 0930   CREATININE 0.68 08/17/2015 0930   CALCIUM 9.5 08/17/2015 0930    Lipid Panel  Component Value Date/Time   CHOL 221 (H) 08/17/2015 0930   TRIG 95.0 08/17/2015 0930   HDL 66.50 08/17/2015 0930   CHOLHDL 3 08/17/2015 0930   VLDL 19.0 08/17/2015 0930   LDLCALC 135 (H) 08/17/2015 0930    CBC    Component Value Date/Time   WBC 5.3 08/17/2015 0930   RBC 4.63 08/17/2015 0930   HGB 13.8 08/17/2015 0930   HCT 40.7 08/17/2015 0930   PLT 223.0 08/17/2015 0930   MCV 87.9 08/17/2015 0930   MCHC 34.0 08/17/2015 0930   RDW 12.6 08/17/2015 0930    Hgb A1C No results found for: HGBA1C        Assessment & Plan:   Constipation:  Discussed increasing her water intake and becoming more physically active Start taking a probiotic daily We will discuss colonoscopy at your annual exam  RTC in 1 month for your annual exam Nicki Reaper, NP

## 2016-12-29 ENCOUNTER — Telehealth: Payer: Self-pay

## 2016-12-29 NOTE — Telephone Encounter (Signed)
She can take a Dulcolax tablet. She really needs to start taking the Mirilax every day. It is more gentle than the Dulcolax, but just doesn't work as quick.

## 2016-12-29 NOTE — Telephone Encounter (Signed)
Left detailed msg on VM per HIPAA  

## 2016-12-29 NOTE — Telephone Encounter (Signed)
Pt called stating she was here this week for chronic constipation got a little bit better but got worse since yesterday she took miralax and helped very, she would like to know if theres anything that she can take that's more "aggressive" since she's having some low back pain.

## 2017-01-30 ENCOUNTER — Encounter: Payer: 59 | Admitting: Internal Medicine

## 2017-02-20 ENCOUNTER — Encounter: Payer: Self-pay | Admitting: Internal Medicine

## 2017-02-20 ENCOUNTER — Ambulatory Visit (INDEPENDENT_AMBULATORY_CARE_PROVIDER_SITE_OTHER): Payer: 59 | Admitting: Internal Medicine

## 2017-02-20 VITALS — BP 120/78 | HR 68 | Temp 97.9°F | Ht 64.5 in | Wt 156.5 lb

## 2017-02-20 DIAGNOSIS — Z0000 Encounter for general adult medical examination without abnormal findings: Secondary | ICD-10-CM

## 2017-02-20 DIAGNOSIS — Z1159 Encounter for screening for other viral diseases: Secondary | ICD-10-CM

## 2017-02-20 DIAGNOSIS — Z1211 Encounter for screening for malignant neoplasm of colon: Secondary | ICD-10-CM

## 2017-02-20 NOTE — Patient Instructions (Signed)
Health Maintenance, Female Adopting a healthy lifestyle and getting preventive care can go a long way to promote health and wellness. Talk with your health care provider about what schedule of regular examinations is right for you. This is a good chance for you to check in with your provider about disease prevention and staying healthy. In between checkups, there are plenty of things you can do on your own. Experts have done a lot of research about which lifestyle changes and preventive measures are most likely to keep you healthy. Ask your health care provider for more information. Weight and diet Eat a healthy diet  Be sure to include plenty of vegetables, fruits, low-fat dairy products, and lean protein.  Do not eat a lot of foods high in solid fats, added sugars, or salt.  Get regular exercise. This is one of the most important things you can do for your health.  Most adults should exercise for at least 150 minutes each week. The exercise should increase your heart rate and make you sweat (moderate-intensity exercise).  Most adults should also do strengthening exercises at least twice a week. This is in addition to the moderate-intensity exercise. Maintain a healthy weight  Body mass index (BMI) is a measurement that can be used to identify possible weight problems. It estimates body fat based on height and weight. Your health care provider can help determine your BMI and help you achieve or maintain a healthy weight.  For females 76 years of age and older:  A BMI below 18.5 is considered underweight.  A BMI of 18.5 to 24.9 is normal.  A BMI of 25 to 29.9 is considered overweight.  A BMI of 30 and above is considered obese. Watch levels of cholesterol and blood lipids  You should start having your blood tested for lipids and cholesterol at 55 years of age, then have this test every 5 years.  You may need to have your cholesterol levels checked more often if:  Your lipid or  cholesterol levels are high.  You are older than 55 years of age.  You are at high risk for heart disease. Cancer screening Lung Cancer  Lung cancer screening is recommended for adults 64-42 years old who are at high risk for lung cancer because of a history of smoking.  A yearly low-dose CT scan of the lungs is recommended for people who:  Currently smoke.  Have quit within the past 15 years.  Have at least a 30-pack-year history of smoking. A pack year is smoking an average of one pack of cigarettes a day for 1 year.  Yearly screening should continue until it has been 15 years since you quit.  Yearly screening should stop if you develop a health problem that would prevent you from having lung cancer treatment. Breast Cancer  Practice breast self-awareness. This means understanding how your breasts normally appear and feel.  It also means doing regular breast self-exams. Let your health care provider know about any changes, no matter how small.  If you are in your 20s or 30s, you should have a clinical breast exam (CBE) by a health care provider every 1-3 years as part of a regular health exam.  If you are 34 or older, have a CBE every year. Also consider having a breast X-ray (mammogram) every year.  If you have a family history of breast cancer, talk to your health care provider about genetic screening.  If you are at high risk for breast cancer, talk  to your health care provider about having an MRI and a mammogram every year.  Breast cancer gene (BRCA) assessment is recommended for women who have family members with BRCA-related cancers. BRCA-related cancers include:  Breast.  Ovarian.  Tubal.  Peritoneal cancers.  Results of the assessment will determine the need for genetic counseling and BRCA1 and BRCA2 testing. Cervical Cancer  Your health care provider may recommend that you be screened regularly for cancer of the pelvic organs (ovaries, uterus, and vagina).  This screening involves a pelvic examination, including checking for microscopic changes to the surface of your cervix (Pap test). You may be encouraged to have this screening done every 3 years, beginning at age 24.  For women ages 66-65, health care providers may recommend pelvic exams and Pap testing every 3 years, or they may recommend the Pap and pelvic exam, combined with testing for human papilloma virus (HPV), every 5 years. Some types of HPV increase your risk of cervical cancer. Testing for HPV may also be done on women of any age with unclear Pap test results.  Other health care providers may not recommend any screening for nonpregnant women who are considered low risk for pelvic cancer and who do not have symptoms. Ask your health care provider if a screening pelvic exam is right for you.  If you have had past treatment for cervical cancer or a condition that could lead to cancer, you need Pap tests and screening for cancer for at least 20 years after your treatment. If Pap tests have been discontinued, your risk factors (such as having a new sexual partner) need to be reassessed to determine if screening should resume. Some women have medical problems that increase the chance of getting cervical cancer. In these cases, your health care provider may recommend more frequent screening and Pap tests. Colorectal Cancer  This type of cancer can be detected and often prevented.  Routine colorectal cancer screening usually begins at 55 years of age and continues through 55 years of age.  Your health care provider may recommend screening at an earlier age if you have risk factors for colon cancer.  Your health care provider may also recommend using home test kits to check for hidden blood in the stool.  A small camera at the end of a tube can be used to examine your colon directly (sigmoidoscopy or colonoscopy). This is done to check for the earliest forms of colorectal cancer.  Routine  screening usually begins at age 41.  Direct examination of the colon should be repeated every 5-10 years through 55 years of age. However, you may need to be screened more often if early forms of precancerous polyps or small growths are found. Skin Cancer  Check your skin from head to toe regularly.  Tell your health care provider about any new moles or changes in moles, especially if there is a change in a mole's shape or color.  Also tell your health care provider if you have a mole that is larger than the size of a pencil eraser.  Always use sunscreen. Apply sunscreen liberally and repeatedly throughout the day.  Protect yourself by wearing long sleeves, pants, a wide-brimmed hat, and sunglasses whenever you are outside. Heart disease, diabetes, and high blood pressure  High blood pressure causes heart disease and increases the risk of stroke. High blood pressure is more likely to develop in:  People who have blood pressure in the high end of the normal range (130-139/85-89 mm Hg).  People who are overweight or obese.  People who are African American.  If you are 59-24 years of age, have your blood pressure checked every 3-5 years. If you are 34 years of age or older, have your blood pressure checked every year. You should have your blood pressure measured twice-once when you are at a hospital or clinic, and once when you are not at a hospital or clinic. Record the average of the two measurements. To check your blood pressure when you are not at a hospital or clinic, you can use:  An automated blood pressure machine at a pharmacy.  A home blood pressure monitor.  If you are between 29 years and 60 years old, ask your health care provider if you should take aspirin to prevent strokes.  Have regular diabetes screenings. This involves taking a blood sample to check your fasting blood sugar level.  If you are at a normal weight and have a low risk for diabetes, have this test once  every three years after 55 years of age.  If you are overweight and have a high risk for diabetes, consider being tested at a younger age or more often. Preventing infection Hepatitis B  If you have a higher risk for hepatitis B, you should be screened for this virus. You are considered at high risk for hepatitis B if:  You were born in a country where hepatitis B is common. Ask your health care provider which countries are considered high risk.  Your parents were born in a high-risk country, and you have not been immunized against hepatitis B (hepatitis B vaccine).  You have HIV or AIDS.  You use needles to inject street drugs.  You live with someone who has hepatitis B.  You have had sex with someone who has hepatitis B.  You get hemodialysis treatment.  You take certain medicines for conditions, including cancer, organ transplantation, and autoimmune conditions. Hepatitis C  Blood testing is recommended for:  Everyone born from [birth year] through 1964.  Anyone with known risk factors for hepatitis C. Sexually transmitted infections (STIs)  You should be screened for sexually transmitted infections (STIs) including gonorrhea and chlamydia if:  You are sexually active and are younger than 55 years of age.  You are older than 55 years of age and your health care provider tells you that you are at risk for this type of infection.  Your sexual activity has changed since you were last screened and you are at an increased risk for chlamydia or gonorrhea. Ask your health care provider if you are at risk.  If you do not have HIV, but are at risk, it may be recommended that you take a prescription medicine daily to prevent HIV infection. This is called pre-exposure prophylaxis (PrEP). You are considered at risk if:  You are sexually active and do not regularly use condoms or know the HIV status of your partner(s).  You take drugs by injection.  You are sexually active with a partner  who has HIV. Talk with your health care provider about whether you are at high risk of being infected with HIV. If you choose to begin PrEP, you should first be tested for HIV. You should then be tested every 3 months for as long as you are taking PrEP. Pregnancy  If you are premenopausal and you may become pregnant, ask your health care provider about preconception counseling.  If you may become pregnant, take 400 to 800 micrograms (mcg) of folic acid  every day.  If you want to prevent pregnancy, talk to your health care provider about birth control (contraception). Osteoporosis and menopause  Osteoporosis is a disease in which the bones lose minerals and strength with aging. This can result in serious bone fractures. Your risk for osteoporosis can be identified using a bone density scan.  If you are 4 years of age or older, or if you are at risk for osteoporosis and fractures, ask your health care provider if you should be screened.  Ask your health care provider whether you should take a calcium or vitamin D supplement to lower your risk for osteoporosis.  Menopause may have certain physical symptoms and risks.  Hormone replacement therapy may reduce some of these symptoms and risks. Talk to your health care provider about whether hormone replacement therapy is right for you. Follow these instructions at home:  Schedule regular health, dental, and eye exams.  Stay current with your immunizations.  Do not use any tobacco products including cigarettes, chewing tobacco, or electronic cigarettes.  If you are pregnant, do not drink alcohol.  If you are breastfeeding, limit how much and how often you drink alcohol.  Limit alcohol intake to no more than 1 drink per day for nonpregnant women. One drink equals 12 ounces of beer, 5 ounces of wine, or 1 ounces of hard liquor.  Do not use street drugs.  Do not share needles.  Ask your health care provider for help if you need support  or information about quitting drugs.  Tell your health care provider if you often feel depressed.  Tell your health care provider if you have ever been abused or do not feel safe at home. This information is not intended to replace advice given to you by your health care provider. Make sure you discuss any questions you have with your health care provider. Document Released: 06/26/2011 Document Revised: 05/18/2016 Document Reviewed: 09/14/2015 Elsevier Interactive Patient Education  2017 [employer].

## 2017-02-20 NOTE — Progress Notes (Signed)
Subjective:    Patient ID: Joanna Peterson, female    DOB: Aug 19, 1961, 56 y.o.   MRN: [id number]  HPI  Pt presents to the clinic today for her annual exam.  Flu: 09/2016 Tetanus: 09/2006 Pap Smear: Partial Hysterectolmy Mammogram: 10/2015 at [facility], scheduled Colon Screening: never, will schedule Vision Screening: as needed Dentist: biannually  Diet: She does eat lean meat. She consumes fruits and veggies daily. She tries to avoid fried foods. She drinks mostly water. Exercise: She runs on the treadmill for about 45 minutes 3 days a week  Review of Systems      Past Medical History:  Diagnosis Date  . Chicken pox   . GERD (gastroesophageal reflux disease)   . History of kidney stones     Current Outpatient Prescriptions  Medication Sig Dispense Refill  . cyanocobalamin 1000 MCG tablet Take 1,000 mcg by mouth daily.    . Inulin (FIBER CHOICE PO) Take 1 scoop by mouth every other day.    Marland Kitchen MAGNESIUM CITRATE PO Take 1 tablet by mouth at bedtime.    . Multiple Vitamin (MULTIVITAMIN) tablet Take 1 tablet by mouth daily.     No current facility-administered medications for this visit.     Allergies  Allergen Reactions  . Sulfa Antibiotics Nausea Only  . Penicillins Rash    Family History  Problem Relation Age of Onset  . Heart disease Father   . Heart disease Maternal Grandfather   . Bone cancer Paternal Grandmother     Social History   Social History  . Marital status: Single    Spouse name: N/A  . Number of children: N/A  . Years of education: N/A   Occupational History  . Not on file.   Social History Main Topics  . Smoking status: Never Smoker  . Smokeless tobacco: Never Used  . Alcohol use 0.0 oz/week     Comment: occasional  . Drug use: No  . Sexual activity: Yes   Other Topics Concern  . Not on file   Social History Narrative  . No narrative on file     Constitutional: Denies fever, malaise, fatigue, headache or abrupt  weight changes.  HEENT: Denies eye pain, eye redness, ear pain, ringing in the ears, wax buildup, runny nose, nasal congestion, bloody nose, or sore throat. Respiratory: Denies difficulty breathing, shortness of breath, cough or sputum production.   Cardiovascular: Denies chest pain, chest tightness, palpitations or swelling in the hands or feet.  Gastrointestinal: Denies abdominal pain, bloating, constipation, diarrhea or blood in the stool.  GU: Denies urgency, frequency, pain with urination, burning sensation, blood in urine, odor or discharge. Musculoskeletal: Denies decrease in range of motion, difficulty with gait, muscle pain or joint pain and swelling.  Skin: Denies redness, rashes, lesions or ulcercations.  Neurological: Denies dizziness, difficulty with memory, difficulty with speech or problems with balance and coordination.  Psych: Denies anxiety, depression, SI/HI.  No other specific complaints in a complete review of systems (except as listed in HPI above).  Objective:   Physical Exam  BP 120/78   Pulse 68   Temp 97.9 F (36.6 C) (Oral)   Ht 5' 4.5" (1.638 m)   Wt 156 lb 8 oz (71 kg)   SpO2 98%   BMI 26.45 kg/m  Wt Readings from Last 3 Encounters:  02/20/17 156 lb 8 oz (71 kg)  12/26/16 152 lb (68.9 kg)  08/23/15 153 lb (69.4 kg)    General: Appears her  stated age, well developed, well nourished in NAD. Skin: Warm, dry and intact. HEENT: Head: normal shape and size; Eyes: sclera white, no icterus, conjunctiva pink, PERRLA and EOMs intact; Ears: Tm's gray and intact, normal light reflex;Throat/Mouth: Teeth present, mucosa pink and moist, no exudate, lesions or ulcerations noted.  Neck:  Neck supple, trachea midline.  Cardiovascular: Normal rate and rhythm. S1,S2 noted.  No murmur, rubs or gallops noted. No JVD or BLE edema. No carotid bruits noted. Pulmonary/Chest: Normal effort and positive vesicular breath sounds. No respiratory distress. No wheezes, rales or ronchi  noted.  Abdomen: Soft and nontender. Normal bowel sounds. No distention or masses noted. Liver, spleen and kidneys non palpable. Pelvic: Normal female anatomy. Adnexa non palpable. Musculoskeletal: Strength 5/5 BUE/BLE. No difficulty with gait.  Neurological: Alert and oriented. Cranial nerves II-XII grossly intact. Coordination normal.  Psychiatric: Mood and affect normal. Behavior is normal. Judgment and thought content normal.    BMET    Component Value Date/Time   NA 141 08/17/2015 0930   K 4.1 08/17/2015 0930   CL 103 08/17/2015 0930   CO2 31 08/17/2015 0930   GLUCOSE 94 08/17/2015 0930   BUN 15 08/17/2015 0930   CREATININE 0.68 08/17/2015 0930   CALCIUM 9.5 08/17/2015 0930    Lipid Panel     Component Value Date/Time   CHOL 221 (H) 08/17/2015 0930   TRIG 95.0 08/17/2015 0930   HDL 66.50 08/17/2015 0930   CHOLHDL 3 08/17/2015 0930   VLDL 19.0 08/17/2015 0930   LDLCALC 135 (H) 08/17/2015 0930    CBC    Component Value Date/Time   WBC 5.3 08/17/2015 0930   RBC 4.63 08/17/2015 0930   HGB 13.8 08/17/2015 0930   HCT 40.7 08/17/2015 0930   PLT 223.0 08/17/2015 0930   MCV 87.9 08/17/2015 0930   MCHC 34.0 08/17/2015 0930   RDW 12.6 08/17/2015 0930    Hgb A1C No results found for: HGBA1C          Assessment & Plan:   Preventative Health Maintenance:  Flu shot UTD She declines tetanus booster Pelvic exam today She is going to call and schedule her mammogram Referral placed to GI for screening colonoscopy Encouraged her to consume a balanced diet and exercise regimen Advised her to see a eye doctor and dentist annually Will check CBC, CMET, Lipid, TSH and Hep C today  RTC in 1 year, sooner if needed Nicki ReaperBAITY, REGINA, NP

## 2017-02-21 LAB — COMPREHENSIVE METABOLIC PANEL
ALT: 23 U/L (ref 0–35)
AST: 19 U/L (ref 0–37)
Albumin: 4.7 g/dL (ref 3.5–5.2)
Alkaline Phosphatase: 52 U/L (ref 39–117)
BILIRUBIN TOTAL: 0.6 mg/dL (ref 0.2–1.2)
BUN: 11 mg/dL (ref 6–23)
CALCIUM: 9.9 mg/dL (ref 8.4–10.5)
CHLORIDE: 102 meq/L (ref 96–112)
CO2: 28 meq/L (ref 19–32)
Creatinine, Ser: 0.64 mg/dL (ref 0.40–1.20)
GFR: 102.52 mL/min (ref >60.00)
GLUCOSE: 80 mg/dL (ref 70–99)
POTASSIUM: 3.9 meq/L (ref 3.5–5.1)
Sodium: 139 mEq/L (ref 135–145)
Total Protein: 7.9 g/dL (ref 6.0–8.3)

## 2017-02-21 LAB — CBC
HEMATOCRIT: 41 % (ref 36.0–46.0)
HEMOGLOBIN: 13.9 g/dL (ref 12.0–15.0)
MCHC: 34 g/dL (ref 30.0–36.0)
MCV: 89.7 fl (ref 78.0–100.0)
PLATELETS: 227 10*3/uL (ref 150.0–400.0)
RBC: 4.56 Mil/uL (ref 3.87–5.11)
RDW: 13 % (ref 11.5–15.5)
WBC: 5.2 10*3/uL (ref 4.0–10.5)

## 2017-02-21 LAB — LIPID PANEL
CHOL/HDL RATIO: 3
Cholesterol: 274 mg/dL — ABNORMAL HIGH (ref 0–200)
HDL: 87.9 mg/dL (ref >39.00)
LDL Cholesterol: 170 mg/dL — ABNORMAL HIGH (ref 0–99)
NonHDL: 186.17
TRIGLYCERIDES: 83 mg/dL (ref 0.0–149.0)
VLDL: 16.6 mg/dL (ref 0.0–40.0)

## 2017-02-21 LAB — TSH: TSH: 1.74 u[IU]/mL (ref 0.35–4.50)

## 2017-02-21 LAB — HEPATITIS C ANTIBODY: HCV Ab: NEGATIVE

## 2017-03-01 ENCOUNTER — Encounter: Payer: Self-pay | Admitting: Internal Medicine

## 2017-04-20 ENCOUNTER — Ambulatory Visit (AMBULATORY_SURGERY_CENTER): Payer: Self-pay | Admitting: *Deleted

## 2017-04-20 ENCOUNTER — Encounter: Payer: Self-pay | Admitting: Internal Medicine

## 2017-04-20 VITALS — Ht 65.0 in | Wt 160.2 lb

## 2017-04-20 DIAGNOSIS — Z1211 Encounter for screening for malignant neoplasm of colon: Secondary | ICD-10-CM

## 2017-04-20 NOTE — Progress Notes (Signed)
No allergies to eggs or soy. No problems with anesthesia.  Pt given Emmi instructions for colonoscopy  No oxygen use  No diet drug use  

## 2017-04-26 DIAGNOSIS — R635 Abnormal weight gain: Secondary | ICD-10-CM | POA: Diagnosis not present

## 2017-04-26 DIAGNOSIS — N951 Menopausal and female climacteric states: Secondary | ICD-10-CM | POA: Diagnosis not present

## 2017-04-30 DIAGNOSIS — R5383 Other fatigue: Secondary | ICD-10-CM | POA: Diagnosis not present

## 2017-04-30 DIAGNOSIS — N951 Menopausal and female climacteric states: Secondary | ICD-10-CM | POA: Diagnosis not present

## 2017-04-30 DIAGNOSIS — E663 Overweight: Secondary | ICD-10-CM | POA: Diagnosis not present

## 2017-04-30 DIAGNOSIS — E782 Mixed hyperlipidemia: Secondary | ICD-10-CM | POA: Diagnosis not present

## 2017-05-04 ENCOUNTER — Encounter: Payer: Self-pay | Admitting: Internal Medicine

## 2017-05-04 ENCOUNTER — Ambulatory Visit (AMBULATORY_SURGERY_CENTER): Payer: 59 | Admitting: Internal Medicine

## 2017-05-04 VITALS — BP 112/75 | HR 61 | Temp 98.6°F | Resp 16 | Ht 64.5 in | Wt 156.0 lb

## 2017-05-04 DIAGNOSIS — Z1211 Encounter for screening for malignant neoplasm of colon: Secondary | ICD-10-CM | POA: Diagnosis present

## 2017-05-04 DIAGNOSIS — Z1212 Encounter for screening for malignant neoplasm of rectum: Secondary | ICD-10-CM | POA: Diagnosis not present

## 2017-05-04 MED ORDER — SODIUM CHLORIDE 0.9 % IV SOLN: 500.0000 mL | INTRAVENOUS | Status: DC

## 2017-05-04 NOTE — Patient Instructions (Addendum)
   Your colonoscopy was normal.  Next routine colonoscopy or other screening test in 10 years - 2028  I appreciate the opportunity to care for you. Iva Booparl E. Shanan Fitzpatrick, MD, FACG  YOU HAD AN ENDOSCOPIC PROCEDURE TODAY AT THE [facility]:   Refer to the procedure report that was given to you for any specific questions about what was found during the examination.  If the procedure report does not answer your questions, please call your gastroenterologist to clarify.  If you requested that your care partner not be given the details of your procedure findings, then the procedure report has been included in a sealed envelope for you to review at your convenience later.  YOU SHOULD EXPECT: Some feelings of bloating in the abdomen. Passage of more gas than usual.  Walking can help get rid of the air that was put into your GI tract during the procedure and reduce the bloating. If you had a lower endoscopy (such as a colonoscopy or flexible sigmoidoscopy) you may notice spotting of blood in your stool or on the toilet paper. If you underwent a bowel prep for your procedure, you may not have a normal bowel movement for a few days.  Please Note:  You might notice some irritation and congestion in your nose or some drainage.  This is from the oxygen used during your procedure.  There is no need for concern and it should clear up in a day or so.  SYMPTOMS TO REPORT IMMEDIATELY:   Following lower endoscopy (colonoscopy or flexible sigmoidoscopy):  Excessive amounts of blood in the stool  Significant tenderness or worsening of abdominal pains  Swelling of the abdomen that is new, acute  Fever of 100F or higher  For urgent or emergent issues, a gastroenterologist can be reached at any hour by calling [phone].   DIET:  We do recommend a small meal at first, but then you may proceed to your regular diet.  Drink plenty of fluids but you should avoid alcoholic beverages for 24  hours.  ACTIVITY:  You should plan to take it easy for the rest of today and you should NOT DRIVE or use heavy machinery until tomorrow (because of the sedation medicines used during the test).    FOLLOW UP: Our staff will call the number listed on your records the next business day following your procedure to check on you and address any questions or concerns that you may have regarding the information given to you following your procedure. If we do not reach you, we will leave a message.  However, if you are feeling well and you are not experiencing any problems, there is no need to return our call.  We will assume that you have returned to your regular daily activities without incident.  If any biopsies were taken you will be contacted by phone or by letter within the next 1-3 weeks.  Please call us at [phone] if you have not heard about the biopsies in 3 weeks.    SIGNATURES/CONFIDENTIALITY: You and/or your care partner have signed paperwork which will be entered into your electronic medical record.  These signatures attest to the fact that that the information above on your After Visit Summary has been reviewed and is understood.  Full responsibility of the confidentiality of this discharge information lies with you and/or your care-partner.

## 2017-05-04 NOTE — Op Note (Signed)
[facility] Patient Name: Joanna RiasKaren Peterson Procedure Date: 05/04/2017 11:16 AM MRN: [id number] Endoscopist: Iva Booparl E Jaycie Kregel , MD Age: 56 Referring MD:  Date of Birth: 1961/12/15 Gender: Female Account #: [id number] Procedure:                Colonoscopy Indications:              Screening for colorectal malignant neoplasm Medicines:                Propofol per Anesthesia, Monitored Anesthesia Care Procedure:                Pre-Anesthesia Assessment:                           - Prior to the procedure, a History and Physical                            was performed, and patient medications and                            allergies were reviewed. The patient's tolerance of                            previous anesthesia was also reviewed. The risks                            and benefits of the procedure and the sedation                            options and risks were discussed with the patient.                            All questions were answered, and informed consent                            was obtained. Prior Anticoagulants: The patient has                            taken no previous anticoagulant or antiplatelet                            agents. ASA Grade Assessment: II - A patient with                            mild systemic disease. After reviewing the risks                            and benefits, the patient was deemed in                            satisfactory condition to undergo the procedure.                           After obtaining informed consent, the colonoscope  was passed under direct vision. Throughout the                            procedure, the patient's blood pressure, pulse, and                            oxygen saturations were monitored continuously. The                            Colonoscope was introduced through the anus and                            advanced to the the cecum, identified by   appendiceal orifice and ileocecal valve. The                            quality of the bowel preparation was excellent. The                            colonoscopy was performed without difficulty. The                            patient tolerated the procedure well. The bowel                            preparation used was Miralax. The ileocecal valve,                            appendiceal orifice, and rectum were photographed. Scope In: 11:27:20 AM Scope Out: 11:38:07 AM Scope Withdrawal Time: 0 hours 7 minutes 58 seconds  Total Procedure Duration: 0 hours 10 minutes 47 seconds  Findings:                 The perianal and digital rectal examinations were                            normal.                           The colon (entire examined portion) appeared normal.                           No additional abnormalities were found on                            retroflexion. Complications:            No immediate complications. Estimated blood loss:                            None. Estimated Blood Loss:     Estimated blood loss: none. Recommendation:           - Repeat colonoscopy in 10 years for screening                            purposes.                           -  Patient has a contact number available for                            emergencies. The signs and symptoms of potential                            delayed complications were discussed with the                            patient. Return to normal activities tomorrow.                            Written discharge instructions were provided to the                            patient.                           - Resume previous diet.                           - Continue present medications. Iva Boop, MD 05/04/2017 11:43:56 AM This report has been signed electronically.

## 2017-05-04 NOTE — Progress Notes (Signed)
A and O x3. Report to RN. Tolerated MAC anesthesia well.

## 2017-05-07 ENCOUNTER — Telehealth: Payer: Self-pay | Admitting: *Deleted

## 2017-05-07 DIAGNOSIS — E782 Mixed hyperlipidemia: Secondary | ICD-10-CM | POA: Diagnosis not present

## 2017-05-07 DIAGNOSIS — E539 Vitamin B deficiency, unspecified: Secondary | ICD-10-CM | POA: Diagnosis not present

## 2017-05-07 DIAGNOSIS — E663 Overweight: Secondary | ICD-10-CM | POA: Diagnosis not present

## 2017-05-07 NOTE — Telephone Encounter (Signed)
  Follow up Call-  Call back number 05/04/2017  Post procedure Call Back phone  # [phone]  Permission to leave phone message Yes  Some recent data might be hidden     Patient questions:  Do you have a fever, pain , or abdominal swelling? No. Pain Score  0 *  Have you tolerated food without any problems? Yes.    Have you been able to return to your normal activities? Yes.    Do you have any questions about your discharge instructions: Diet   No. Medications  No. Follow up visit  No.  Do you have questions or concerns about your Care? No.  Actions: * If pain score is 4 or above: No action needed, pain <4.

## 2017-05-15 DIAGNOSIS — E782 Mixed hyperlipidemia: Secondary | ICD-10-CM | POA: Diagnosis not present

## 2017-05-15 DIAGNOSIS — E663 Overweight: Secondary | ICD-10-CM | POA: Diagnosis not present

## 2017-05-22 DIAGNOSIS — E782 Mixed hyperlipidemia: Secondary | ICD-10-CM | POA: Diagnosis not present

## 2017-05-22 DIAGNOSIS — E663 Overweight: Secondary | ICD-10-CM | POA: Diagnosis not present

## 2017-05-29 DIAGNOSIS — M2550 Pain in unspecified joint: Secondary | ICD-10-CM | POA: Diagnosis not present

## 2017-05-29 DIAGNOSIS — E663 Overweight: Secondary | ICD-10-CM | POA: Diagnosis not present

## 2017-05-29 DIAGNOSIS — E782 Mixed hyperlipidemia: Secondary | ICD-10-CM | POA: Diagnosis not present

## 2017-05-29 DIAGNOSIS — R5383 Other fatigue: Secondary | ICD-10-CM | POA: Diagnosis not present

## 2017-05-29 DIAGNOSIS — N951 Menopausal and female climacteric states: Secondary | ICD-10-CM | POA: Diagnosis not present

## 2017-05-29 DIAGNOSIS — E539 Vitamin B deficiency, unspecified: Secondary | ICD-10-CM | POA: Diagnosis not present

## 2017-05-29 DIAGNOSIS — G479 Sleep disorder, unspecified: Secondary | ICD-10-CM | POA: Diagnosis not present

## 2017-05-29 DIAGNOSIS — N898 Other specified noninflammatory disorders of vagina: Secondary | ICD-10-CM | POA: Diagnosis not present

## 2017-06-05 DIAGNOSIS — E782 Mixed hyperlipidemia: Secondary | ICD-10-CM | POA: Diagnosis not present

## 2017-06-05 DIAGNOSIS — E663 Overweight: Secondary | ICD-10-CM | POA: Diagnosis not present

## 2017-06-05 DIAGNOSIS — M2550 Pain in unspecified joint: Secondary | ICD-10-CM | POA: Diagnosis not present

## 2017-06-12 DIAGNOSIS — E663 Overweight: Secondary | ICD-10-CM | POA: Diagnosis not present

## 2017-06-12 DIAGNOSIS — E782 Mixed hyperlipidemia: Secondary | ICD-10-CM | POA: Diagnosis not present

## 2017-06-29 DIAGNOSIS — E782 Mixed hyperlipidemia: Secondary | ICD-10-CM | POA: Diagnosis not present

## 2017-06-29 DIAGNOSIS — E663 Overweight: Secondary | ICD-10-CM | POA: Diagnosis not present

## 2017-07-05 DIAGNOSIS — N951 Menopausal and female climacteric states: Secondary | ICD-10-CM | POA: Diagnosis not present

## 2017-07-12 DIAGNOSIS — N951 Menopausal and female climacteric states: Secondary | ICD-10-CM | POA: Diagnosis not present

## 2017-07-12 DIAGNOSIS — R5383 Other fatigue: Secondary | ICD-10-CM | POA: Diagnosis not present

## 2017-07-12 DIAGNOSIS — G479 Sleep disorder, unspecified: Secondary | ICD-10-CM | POA: Diagnosis not present

## 2017-07-12 DIAGNOSIS — R232 Flushing: Secondary | ICD-10-CM | POA: Diagnosis not present

## 2018-07-25 ENCOUNTER — Encounter

## 2018-07-25 ENCOUNTER — Encounter: Payer: 59 | Admitting: Internal Medicine

## 2018-10-07 ENCOUNTER — Encounter: Payer: 59 | Admitting: Internal Medicine

## 2018-12-19 ENCOUNTER — Other Ambulatory Visit: Payer: Self-pay | Admitting: Internal Medicine

## 2018-12-19 DIAGNOSIS — Z1231 Encounter for screening mammogram for malignant neoplasm of breast: Secondary | ICD-10-CM

## 2018-12-23 ENCOUNTER — Ambulatory Visit (INDEPENDENT_AMBULATORY_CARE_PROVIDER_SITE_OTHER): Payer: 59 | Admitting: Internal Medicine

## 2018-12-23 ENCOUNTER — Encounter: Payer: Self-pay | Admitting: Internal Medicine

## 2018-12-23 VITALS — BP 118/76 | HR 65 | Temp 97.8°F | Wt 170.0 lb

## 2018-12-23 DIAGNOSIS — M79622 Pain in left upper arm: Secondary | ICD-10-CM

## 2018-12-23 NOTE — Patient Instructions (Signed)
Breast Self-Awareness  Breast self-awareness means:  · Knowing how your breasts look.  · Knowing how your breasts feel.  · Checking your breasts every month for changes.  · Telling your doctor if you notice a change in your breasts.  Breast self-awareness allows you to notice a breast problem early while it is still small.  How to do a breast self-exam  One way to learn what is normal for your breasts and to check for changes is to do a breast self-exam. To do a breast self-exam:  Look for Changes    1. Take off all the clothes above your waist.  2. Stand in front of a mirror in a room with good lighting.  3. Put your hands on your hips.  4. Push your hands down.  5. Look at your breasts and nipples in the mirror to see if one breast or nipple looks different than the other. Check to see if:  ? The shape of one breast is different.  ? The size of one breast is different.  ? There are wrinkles, dips, and bumps in one breast and not the other.  6. Look at each breast for changes in your skin, such as:  ? Redness.  ? Scaly areas.  7. Look for changes in your nipples, such as:  ? Liquid around the nipples.  ? Bleeding.  ? Dimpling.  ? Redness.  ? A change in where the nipples are.  Feel for Changes  1. Lie on your back on the floor.  2. Feel each breast. To do this, follow these steps:  ? Pick a breast to feel.  ? Put the arm closest to that breast above your head.  ? Use your other arm to feel the nipple area of your breast. Feel the area with the pads of your three middle fingers by making small circles with your fingers. For the first circle, press lightly. For the second circle, press harder. For the third circle, press even harder.  ? Keep making circles with your fingers at the light, harder, and even harder pressures as you move down your breast. Stop when you feel your ribs.  ? Move your fingers a little toward the center of your body.  ? Start making circles with your fingers again, this time going up until you  reach your collarbone.  ? Keep making up and down circles until you reach your armpit. Remember to keep using the three pressures.  ? Feel the other breast in the same way.  3. Sit or stand in the shower or tub.  4. With soapy water on your skin, feel each breast the same way you did in step 2, when you were lying on the floor.  Write Down What You Find  After doing the self-exam, write down:  · What is normal for each breast.  · Any changes you find in each breast.  · When you last had your period.    How often should I check my breasts?  Check your breasts every month. If you are breastfeeding, the best time to check them is after you feed your baby or after you use a breast pump. If you get periods, the best time to check your breasts is 5-7 days after your period is over.  When should I see my doctor?  See your doctor if you notice:  · A change in shape or size of your breasts or nipples.  · A change in the skin   of your breast or nipples, such as red or scaly skin.  · Unusual fluid coming from your nipples.  · A lump or thick area that was not there before.  · Pain in your breasts.  · Anything that concerns you.  This information is not intended to replace advice given to you by your health care provider. Make sure you discuss any questions you have with your health care provider.  Document Released: 05/29/2008 Document Revised: 05/18/2016 Document Reviewed: 10/31/2015  Elsevier Interactive Patient Education © 2019 [employer]

## 2018-12-23 NOTE — Progress Notes (Signed)
Subjective:    Patient ID: Domenick GongKaren R Bolinger, female    DOB: 1961-03-14, 57 y.o.   MRN: [id number]  HPI  Pt presents to the clinic today with c/o tenderness under her left armpit. She noticed this last week. She did not notice any redness, warmth, swelling or mass. The pain has improved. She would like a breast exam today. She has not noticed any masses, lumps, changes in the skin of the breast or nipple discharge. She has a screening mammogram scheduled for January 2020  Review of Systems      Past Medical History:  Diagnosis Date  . Chicken pox   . GERD (gastroesophageal reflux disease)   . History of kidney stones   . Kidney stones     Current Outpatient Medications  Medication Sig Dispense Refill  . cyanocobalamin 1000 MCG tablet Take 500 mcg by mouth daily.     . Probiotic Product (ALIGN) 4 MG CAPS Take by mouth.     Current Facility-Administered Medications  Medication Dose Route Frequency Provider Last Rate Last Dose  . 0.9 %  sodium chloride infusion  500 mL Intravenous Continuous Iva BoopGessner, Carl E, MD        Allergies  Allergen Reactions  . Sulfa Antibiotics Nausea Only  . Penicillins Rash    Family History  Problem Relation Age of Onset  . Heart disease Father   . Heart disease Maternal Grandfather   . Bone cancer Paternal Grandmother   . Colon cancer Neg Hx   . Esophageal cancer Neg Hx   . Rectal cancer Neg Hx   . Stomach cancer Neg Hx     Social History   Socioeconomic History  . Marital status: Single    Spouse name: Not on file  . Number of children: Not on file  . Years of education: Not on file  . Highest education level: Not on file  Occupational History  . Not on file  Social Needs  . Financial resource strain: Not on file  . Food insecurity:    Worry: Not on file    Inability: Not on file  . Transportation needs:    Medical: Not on file    Non-medical: Not on file  Tobacco Use  . Smoking status: Never Smoker  . Smokeless tobacco:  Never Used  Substance and Sexual Activity  . Alcohol use: Yes    Alcohol/week: 0.0 standard drinks    Comment: occasional  . Drug use: No  . Sexual activity: Yes  Lifestyle  . Physical activity:    Days per week: Not on file    Minutes per session: Not on file  . Stress: Not on file  Relationships  . Social connections:    Talks on phone: Not on file    Gets together: Not on file    Attends religious service: Not on file    Active member of club or organization: Not on file    Attends meetings of clubs or organizations: Not on file    Relationship status: Not on file  . Intimate partner violence:    Fear of current or ex partner: Not on file    Emotionally abused: Not on file    Physically abused: Not on file    Forced sexual activity: Not on file  Other Topics Concern  . Not on file  Social History Narrative  . Not on file     Constitutional: Denies fever, malaise, fatigue, headache or abrupt weight changes.  Respiratory: Denies  difficulty breathing, shortness of breath, cough or sputum production.   Cardiovascular: Denies chest pain, chest tightness, palpitations or swelling in the hands or feet.  Skin: Pt reports tenderness of left armpit that has resolved. Denies redness, rashes, lesions or ulcercations.   No other specific complaints in a complete review of systems (except as listed in HPI above).  Objective:   Physical Exam   BP 118/76   Pulse 65   Temp 97.8 F (36.6 C) (Oral)   Wt 170 lb (77.1 kg)   SpO2 98%   BMI 28.73 kg/m  Wt Readings from Last 3 Encounters:  12/23/18 170 lb (77.1 kg)  05/04/17 156 lb (70.8 kg)  04/20/17 160 lb 3.2 oz (72.7 kg)    General: Appears her stated age, well developed, well nourished in NAD. Breast: Symmetrical, without masses, lumps. No discharge from the nipple noted. No adenopathy noted in the armpit.  BMET    Component Value Date/Time   NA 139 02/20/2017 1611   K 3.9 02/20/2017 1611   CL 102 02/20/2017 1611    CO2 28 02/20/2017 1611   GLUCOSE 80 02/20/2017 1611   BUN 11 02/20/2017 1611   CREATININE 0.64 02/20/2017 1611   CALCIUM 9.9 02/20/2017 1611    Lipid Panel     Component Value Date/Time   CHOL 274 (H) 02/20/2017 1611   TRIG 83.0 02/20/2017 1611   HDL 87.90 02/20/2017 1611   CHOLHDL 3 02/20/2017 1611   VLDL 16.6 02/20/2017 1611   LDLCALC 170 (H) 02/20/2017 1611    CBC    Component Value Date/Time   WBC 5.2 02/20/2017 1611   RBC 4.56 02/20/2017 1611   HGB 13.9 02/20/2017 1611   HCT 41.0 02/20/2017 1611   PLT 227.0 02/20/2017 1611   MCV 89.7 02/20/2017 1611   MCHC 34.0 02/20/2017 1611   RDW 13.0 02/20/2017 1611    Hgb A1C No results found for: HGBA1C         Assessment & Plan:   Tenderness of Left Armpit:  Breast exam benign and pain now resolved No adenopathy noted of the left armpit Proceed with screening mammogram  Return precautions disucssed Nicki Reaperegina , NP

## 2019-01-21 ENCOUNTER — Ambulatory Visit
Admission: RE | Admit: 2019-01-21 | Discharge: 2019-01-21 | Disposition: A | Discharge status: Home / Self Care | Payer: 59 | Source: Ambulatory Visit | Attending: Internal Medicine | Admitting: Internal Medicine

## 2019-01-21 DIAGNOSIS — Z1231 Encounter for screening mammogram for malignant neoplasm of breast: Secondary | ICD-10-CM

## 2019-03-11 ENCOUNTER — Encounter: Payer: 59 | Admitting: Internal Medicine

## 2019-04-10 ENCOUNTER — Other Ambulatory Visit: Payer: Self-pay | Admitting: Internal Medicine

## 2019-08-11 ENCOUNTER — Other Ambulatory Visit: Payer: Self-pay

## 2019-08-11 ENCOUNTER — Encounter: Payer: Self-pay | Admitting: Internal Medicine

## 2019-08-11 ENCOUNTER — Ambulatory Visit (INDEPENDENT_AMBULATORY_CARE_PROVIDER_SITE_OTHER): Payer: 59 | Admitting: Internal Medicine

## 2019-08-11 VITALS — BP 118/80 | HR 75 | Temp 97.7°F | Ht 64.75 in | Wt 168.0 lb

## 2019-08-11 DIAGNOSIS — E039 Hypothyroidism, unspecified: Secondary | ICD-10-CM | POA: Diagnosis not present

## 2019-08-11 DIAGNOSIS — Z0000 Encounter for general adult medical examination without abnormal findings: Secondary | ICD-10-CM | POA: Diagnosis not present

## 2019-08-11 DIAGNOSIS — E038 Other specified hypothyroidism: Secondary | ICD-10-CM

## 2019-08-11 DIAGNOSIS — Z23 Encounter for immunization: Secondary | ICD-10-CM | POA: Diagnosis not present

## 2019-08-11 LAB — COMPREHENSIVE METABOLIC PANEL
ALT: 16 U/L (ref 0–35)
AST: 19 U/L (ref 0–37)
Albumin: 4.5 g/dL (ref 3.5–5.2)
Alkaline Phosphatase: 56 U/L (ref 39–117)
BUN: 9 mg/dL (ref 6–23)
CO2: 28 mEq/L (ref 19–32)
Calcium: 9.4 mg/dL (ref 8.4–10.5)
Chloride: 103 mEq/L (ref 96–112)
Creatinine, Ser: 0.71 mg/dL (ref 0.40–1.20)
GFR: 84.8 mL/min (ref >60.00)
Glucose, Bld: 98 mg/dL (ref 70–99)
Potassium: 4.1 mEq/L (ref 3.5–5.1)
Sodium: 139 mEq/L (ref 135–145)
Total Bilirubin: 0.6 mg/dL (ref 0.2–1.2)
Total Protein: 7.3 g/dL (ref 6.0–8.3)

## 2019-08-11 LAB — LIPID PANEL
Cholesterol: 219 mg/dL — ABNORMAL HIGH (ref 0–200)
HDL: 61.3 mg/dL (ref >39.00)
LDL Cholesterol: 133 mg/dL — ABNORMAL HIGH (ref 0–99)
NonHDL: 158.04
Total CHOL/HDL Ratio: 4
Triglycerides: 123 mg/dL (ref 0.0–149.0)
VLDL: 24.6 mg/dL (ref 0.0–40.0)

## 2019-08-11 LAB — CBC
HCT: 40.8 % (ref 36.0–46.0)
Hemoglobin: 13.6 g/dL (ref 12.0–15.0)
MCHC: 33.3 g/dL (ref 30.0–36.0)
MCV: 89.2 fl (ref 78.0–100.0)
Platelets: 223 10*3/uL (ref 150.0–400.0)
RBC: 4.58 Mil/uL (ref 3.87–5.11)
RDW: 13.5 % (ref 11.5–15.5)
WBC: 5.5 10*3/uL (ref 4.0–10.5)

## 2019-08-11 LAB — TSH: TSH: 0.97 u[IU]/mL (ref 0.35–4.50)

## 2019-08-11 LAB — VITAMIN D 25 HYDROXY (VIT D DEFICIENCY, FRACTURES): VITD: 30.91 ng/mL (ref 30.00–100.00)

## 2019-08-11 NOTE — Patient Instructions (Signed)

## 2019-08-11 NOTE — Assessment & Plan Note (Signed)
TSH today Not on supplement Will monitor

## 2019-08-11 NOTE — Addendum Note (Signed)
Addended by: Lurlean Nanny on: 08/11/2019 11:28 AM   Modules accepted: Orders

## 2019-08-11 NOTE — Progress Notes (Signed)
Subjective:    Patient ID: Joanna Peterson, female    DOB: 01-26-1961, 58 y.o.   MRN: [id number]  HPI  Pt presents to the clinic today for her annual exam.  Hypothyroidism: Subclinical. She is not taking Levothyroxine at this time. She does request a TSH with other labs today.  Flu: 09/2018 Tetanus: 09/2006 Pap Smear: hysterectomy Mammogram: 12/2018 Colon Screening: 04/2017 Vision Screening: as needed Dentist: annually  Diet: She does eat meat. She consumes fruits and veggies daily. She raraely eats fried foods. She drinks mostly water. Exercise: Walking and piliates 5 x week  Review of Systems  Past Medical History:  Diagnosis Date  . Chicken pox   . GERD (gastroesophageal reflux disease)   . History of kidney stones   . Kidney stones     Current Outpatient Medications  Medication Sig Dispense Refill  . cyanocobalamin 1000 MCG tablet Take 500 mcg by mouth daily.     . Probiotic Product (ALIGN) 4 MG CAPS Take by mouth.     Current Facility-Administered Medications  Medication Dose Route Frequency Provider Last Rate Last Dose  . 0.9 %  sodium chloride infusion  500 mL Intravenous Continuous Iva BoopGessner, Carl E, MD        Allergies  Allergen Reactions  . Sulfa Antibiotics Nausea Only  . Penicillins Rash    Family History  Problem Relation Age of Onset  . Heart disease Father   . Heart disease Maternal Grandfather   . Bone cancer Paternal Grandmother   . Colon cancer Neg Hx   . Esophageal cancer Neg Hx   . Rectal cancer Neg Hx   . Stomach cancer Neg Hx     Social History   Socioeconomic History  . Marital status: Single    Spouse name: Not on file  . Number of children: Not on file  . Years of education: Not on file  . Highest education level: Not on file  Occupational History  . Not on file  Social Needs  . Financial resource strain: Not on file  . Food insecurity    Worry: Not on file    Inability: Not on file  . Transportation needs    Medical:  Not on file    Non-medical: Not on file  Tobacco Use  . Smoking status: Never Smoker  . Smokeless tobacco: Never Used  Substance and Sexual Activity  . Alcohol use: Yes    Alcohol/week: 0.0 standard drinks    Comment: occasional  . Drug use: No  . Sexual activity: Yes  Lifestyle  . Physical activity    Days per week: Not on file    Minutes per session: Not on file  . Stress: Not on file  Relationships  . Social Musicianconnections    Talks on phone: Not on file    Gets together: Not on file    Attends religious service: Not on file    Active member of club or organization: Not on file    Attends meetings of clubs or organizations: Not on file    Relationship status: Not on file  . Intimate partner violence    Fear of current or ex partner: Not on file    Emotionally abused: Not on file    Physically abused: Not on file    Forced sexual activity: Not on file  Other Topics Concern  . Not on file  Social History Narrative  . Not on file     Constitutional: Denies fever, malaise, fatigue,  headache or abrupt weight changes.  HEENT: Denies eye pain, eye redness, ear pain, ringing in the ears, wax buildup, runny nose, nasal congestion, bloody nose, or sore throat. Respiratory: Denies difficulty breathing, shortness of breath, cough or sputum production.   Cardiovascular: Denies chest pain, chest tightness, palpitations or swelling in the hands or feet.  Gastrointestinal: Denies abdominal pain, bloating, constipation, diarrhea or blood in the stool.  GU: Denies urgency, frequency, pain with urination, burning sensation, blood in urine, odor or discharge. Musculoskeletal: Denies decrease in range of motion, difficulty with gait, muscle pain or joint pain and swelling.  Skin: Denies redness, rashes, lesions or ulcercations.  Neurological: Denies dizziness, difficulty with memory, difficulty with speech or problems with balance and coordination.  Psych: Denies anxiety, depression, SI/HI.   No other specific complaints in a complete review of systems (except as listed in HPI above).     Objective:   Physical Exam  BP 118/80   Pulse 75   Temp 97.7 F (36.5 C) (Temporal)   Ht 5' 4.75" (1.645 m)   Wt 168 lb (76.2 kg)   SpO2 100%   BMI 28.17 kg/m  Wt Readings from Last 3 Encounters:  08/11/19 168 lb (76.2 kg)  12/23/18 170 lb (77.1 kg)  05/04/17 156 lb (70.8 kg)    General: Appears her stated age, well developed, well nourished in NAD. Skin: Warm, dry and intact. No rashesnoted. HEENT: Head: normal shape and size; Eyes: sclera white, no icterus, conjunctiva pink, PERRLA and EOMs intact; Ears: Tm's gray and intact, normal light reflex;  Neck:  Neck supple, trachea midline. No masses, lumps or thyromegaly present.  Cardiovascular: Normal rate and rhythm. S1,S2 noted.  No murmur, rubs or gallops noted. No JVD or BLE edema. No carotid bruits noted. Pulmonary/Chest: Normal effort and positive vesicular breath sounds. No respiratory distress. No wheezes, rales or ronchi noted.  Abdomen: Soft and nontender. Normal bowel sounds. No distention or masses noted. Liver, spleen and kidneys non palpable. Musculoskeletal: Strength 5/5 BUE/BLE No difficulty with gait.  Neurological: Alert and oriented. Cranial nerves II-XII grossly intact. Coordination normal.  Psychiatric: Mood and affect normal. Behavior is normal. Judgment and thought content normal.     BMET    Component Value Date/Time   NA 139 02/20/2017 1611   K 3.9 02/20/2017 1611   CL 102 02/20/2017 1611   CO2 28 02/20/2017 1611   GLUCOSE 80 02/20/2017 1611   BUN 11 02/20/2017 1611   CREATININE 0.64 02/20/2017 1611   CALCIUM 9.9 02/20/2017 1611    Lipid Panel     Component Value Date/Time   CHOL 274 (H) 02/20/2017 1611   TRIG 83.0 02/20/2017 1611   HDL 87.90 02/20/2017 1611   CHOLHDL 3 02/20/2017 1611   VLDL 16.6 02/20/2017 1611   LDLCALC 170 (H) 02/20/2017 1611    CBC    Component Value Date/Time    WBC 5.2 02/20/2017 1611   RBC 4.56 02/20/2017 1611   HGB 13.9 02/20/2017 1611   HCT 41.0 02/20/2017 1611   PLT 227.0 02/20/2017 1611   MCV 89.7 02/20/2017 1611   MCHC 34.0 02/20/2017 1611   RDW 13.0 02/20/2017 1611    Hgb A1C No results found for: HGBA1C          Assessment & Plan:   Preventative Health Maintenance:  Encouraged her to get a flu shot in the fall Tdap today She no longer needs pap smears Mammogram due 12/2019 Colon screening UTD Encouraged her to consume a  balanced diet and exercise regimen Advised her to see an eye doctor and dentist annually Will check CBC, CMET, TSH, Lipid and Vit D today  RTC in 1 year, sooner if needed Webb Silversmith, NP

## 2020-01-07 DIAGNOSIS — H2513 Age-related nuclear cataract, bilateral: Secondary | ICD-10-CM | POA: Diagnosis not present

## 2020-01-07 DIAGNOSIS — H5203 Hypermetropia, bilateral: Secondary | ICD-10-CM | POA: Diagnosis not present

## 2020-01-07 DIAGNOSIS — H52223 Regular astigmatism, bilateral: Secondary | ICD-10-CM | POA: Diagnosis not present

## 2020-01-07 DIAGNOSIS — H524 Presbyopia: Secondary | ICD-10-CM | POA: Diagnosis not present

## 2020-09-21 ENCOUNTER — Telehealth: Payer: 59 | Admitting: Physician Assistant

## 2020-09-21 DIAGNOSIS — Z20822 Contact with and (suspected) exposure to covid-19: Secondary | ICD-10-CM

## 2020-09-21 MED ORDER — DM-GUAIFENESIN ER 30-600 MG PO TB12: 1.0000 | ORAL_TABLET | Freq: Two times a day (BID) | ORAL | 0 refills | Status: DC | PRN

## 2020-09-21 NOTE — Progress Notes (Signed)
E-Visit for Corona Virus Screening  Your current symptoms could be consistent with the coronavirus.  Many health care providers can now test patients at their office but not all are.  [facility] has multiple testing sites. For information on our COVID testing locations and hours go to [email or web address]  We are enrolling you in our MyChart Home Monitoring for COVID19 . Daily you will receive a questionnaire within the MyChart website. Our COVID 19 response team will be monitoring your responses daily.  Testing Information: The COVID-19 Community Testing sites are testing BY APPOINTMENT ONLY.  You can schedule online at [email or web address]  If you do not have access to a smart phone or computer you may call [phone] for an appointment.   Additional testing sites in the Community:  . For [facility] Testing sites in [place] [facility]  [email or web address]  . For Pop-up testing sites in [place]  [email or web address]  . For [facility] [email or web address]  . For [facility] testing in [place] and [employer] [email or web address]  . For [facility] testing in [facility]   [email or web address]  For  more information about community testing call [phone]   Please quarantine yourself while awaiting your test results. Please stay home for a minimum of 10 days from the first day of illness with improving symptoms and you have had 24 hours of no fever (without the use of Tylenol (Acetaminophen) Motrin (Ibuprofen) or any fever reducing medication).  Also - Do not get tested prior to returning to work because once you have had a positive test the test can stay  positive for more than a month in some cases.   You should wear a mask or cloth face covering over your nose and mouth if you must be around other people or animals, including pets (even at home). Try to stay at least 6 feet away from other people. This will protect the people around you.  Please continue good preventive care measures, including:  frequent hand-washing, avoid touching your face, cover coughs/sneezes, stay out of crowds and keep a 6 foot distance from others.  COVID-19 is a respiratory illness with symptoms that are similar to the flu. Symptoms are typically mild to moderate, but there have been cases of severe illness and death due to the virus.   The following symptoms may appear 2-14 days after exposure: . Fever . Cough . Shortness of breath or difficulty breathing . Chills . Repeated shaking with chills . Muscle pain . Headache . Sore throat . New loss of taste or smell . Fatigue . Congestion or runny nose . Nausea or vomiting . Diarrhea  Go to the nearest hospital ED for assessment if fever/cough/breathlessness are severe or illness seems like a threat to life.  It is vitally important that if you feel that you have an infection such as this virus or any other virus that you stay home and away from places where you may spread it to others.  You should avoid contact with people age 72 and older.   You can use medication such as Mucinex-DM for cough and nasal congestion. Make sure to take with a full glass of water.   You may also take acetaminophen (Tylenol) as needed for fever.  Reduce your risk of any infection by using the same precautions used for avoiding the common cold or flu:  Marland Kitchen Wash your hands often with soap and warm water for at least 20 seconds.  If soap and water are not readily available, use an  alcohol-based hand sanitizer with at least 60% alcohol.  . If coughing or sneezing, cover your mouth and nose by coughing or sneezing into the elbow areas of your  shirt or coat, into a tissue or into your sleeve (not your hands). . Avoid shaking hands with others and consider head nods or verbal greetings only. . Avoid touching your eyes, nose, or mouth with unwashed hands.  . Avoid close contact with people who are sick. . Avoid places or events with large numbers of people in one location, like concerts or sporting events. . Carefully consider travel plans you have or are making. . If you are planning any travel outside or inside the [place], visit the CDC's Travelers' Health webpage for the latest health notices. . If you have some symptoms but not all symptoms, continue to monitor at home and seek medical attention if your symptoms worsen. . If you are having a medical emergency, call 911.  HOME CARE . Only take medications as instructed by your medical team. . Drink plenty of fluids and get plenty of rest. . A steam or ultrasonic humidifier can help if you have congestion.   GET HELP RIGHT AWAY IF YOU HAVE EMERGENCY WARNING SIGNS** FOR COVID-19. If you or someone is showing any of these signs seek emergency medical care immediately. Call 911 or proceed to your closest emergency facility if: . You develop worsening high fever. . Trouble breathing . Bluish lips or face . Persistent pain or pressure in the chest . New confusion . Inability to wake or stay awake . You cough up blood. . Your symptoms become more severe  **This list is not all possible symptoms. Contact your medical provider for any symptoms that are sever or concerning to you.  MAKE SURE YOU   Understand these instructions.  Will watch your condition.  Will get help right away if you are not doing well or get worse.  Your e-visit answers were reviewed by a board certified advanced clinical practitioner to complete your personal care plan.  Depending on the condition, your plan could have included both over the counter or prescription medications.  If there is a problem please  reply once you have received a response from your provider.  Your safety is important to [place].  If you have drug allergies check your prescription carefully.    You can use MyChart to ask questions about today's visit, request a non-urgent call back, or ask for a work or school excuse for 24 hours related to this e-Visit. If it has been greater than 24 hours you will need to follow up with your provider, or enter a new e-Visit to address those concerns. You will get an e-mail in the next two days asking about your experience.  I hope that your e-visit has been valuable and will speed your recovery. Thank you for using e-visits.  Greater than 5 minutes, yet less than 10 minutes of time have been spent researching, coordinating, and implementing care for this patient today.

## 2020-12-21 DIAGNOSIS — Z03818 Encounter for observation for suspected exposure to other biological agents ruled out: Secondary | ICD-10-CM | POA: Diagnosis not present

## 2021-02-03 DIAGNOSIS — Z03818 Encounter for observation for suspected exposure to other biological agents ruled out: Secondary | ICD-10-CM | POA: Diagnosis not present

## 2021-02-03 DIAGNOSIS — Z20822 Contact with and (suspected) exposure to covid-19: Secondary | ICD-10-CM | POA: Diagnosis not present

## 2021-03-30 DIAGNOSIS — Z20822 Contact with and (suspected) exposure to covid-19: Secondary | ICD-10-CM | POA: Diagnosis not present

## 2021-05-09 ENCOUNTER — Other Ambulatory Visit: Payer: Self-pay

## 2021-05-09 ENCOUNTER — Encounter: Payer: Self-pay | Admitting: Internal Medicine

## 2021-05-09 ENCOUNTER — Ambulatory Visit (INDEPENDENT_AMBULATORY_CARE_PROVIDER_SITE_OTHER): Payer: 59 | Admitting: Internal Medicine

## 2021-05-09 VITALS — BP 120/85 | HR 70 | Temp 97.1°F | Ht 65.0 in | Wt 142.0 lb

## 2021-05-09 DIAGNOSIS — Z0000 Encounter for general adult medical examination without abnormal findings: Secondary | ICD-10-CM | POA: Diagnosis not present

## 2021-05-09 DIAGNOSIS — E038 Other specified hypothyroidism: Secondary | ICD-10-CM

## 2021-05-09 DIAGNOSIS — Z780 Asymptomatic menopausal state: Secondary | ICD-10-CM | POA: Diagnosis not present

## 2021-05-09 DIAGNOSIS — Z1231 Encounter for screening mammogram for malignant neoplasm of breast: Secondary | ICD-10-CM | POA: Diagnosis not present

## 2021-05-09 LAB — CBC
HCT: 41.8 % (ref 35.0–45.0)
MCHC: 33.3 g/dL (ref 32.0–36.0)
MCV: 91.9 fL (ref 80.0–100.0)

## 2021-05-09 NOTE — Assessment & Plan Note (Signed)
TSH today 

## 2021-05-09 NOTE — Patient Instructions (Signed)

## 2021-05-09 NOTE — Progress Notes (Signed)
Subjective:    Patient ID: Joanna Peterson, female    DOB: 1961-09-16, 60 y.o.   MRN: [id number]  HPI  Patient presents the clinic today for her annual exam.  Subclinical Hypothyroidism: Currently no issues. She is not taking any thyroid medication at this time.  Flu: 09/2020 Tetanus: 07/2019 COVID: Pfizer x 3  Pap smear: partial hysterectomy Mammogram: 12/2018 Bone Denstiy: never Colon screening: 04/2017, 10 years Vision screening: as needed Dentist: biannually  Diet: She does eat meat. She consumes fruits and veggies. She tries to avoid fried foods. She drinks mostly water. Exercise: Walking 4 mile 5 x week, [place]  Review of Systems      Past Medical History:  Diagnosis Date  . Chicken pox   . GERD (gastroesophageal reflux disease)   . History of kidney stones   . Kidney stones     Current Outpatient Medications  Medication Sig Dispense Refill  . cyanocobalamin 1000 MCG tablet Take 500 mcg by mouth daily.     Marland Kitchen dextromethorphan-guaiFENesin (MUCINEX DM) 30-600 MG 12hr tablet Take 1 tablet by mouth 2 (two) times daily as needed (cough and congestion). 30 tablet 0  . Probiotic Product (ALIGN) 4 MG CAPS Take by mouth.     No current facility-administered medications for this visit.    Allergies  Allergen Reactions  . Sulfa Antibiotics Nausea Only  . Penicillins Rash    Family History  Problem Relation Age of Onset  . Healthy Mother   . Hypertension Mother   . Heart disease Father   . Heart disease Maternal Grandfather   . Hypertension Sister   . Bone cancer Paternal Grandfather   . Colon cancer Neg Hx   . Esophageal cancer Neg Hx   . Rectal cancer Neg Hx   . Stomach cancer Neg Hx   . Breast cancer Neg Hx     Social History   Socioeconomic History  . Marital status: Single    Spouse name: Not on file  . Number of children: Not on file  . Years of education: Not on file  . Highest education level: Not on file  Occupational History  . Not on file   Tobacco Use  . Smoking status: Never Smoker  . Smokeless tobacco: Never Used  Vaping Use  . Vaping Use: Never used  Substance and Sexual Activity  . Alcohol use: Yes    Alcohol/week: 0.0 standard drinks    Comment: occasional  . Drug use: No  . Sexual activity: Yes  Other Topics Concern  . Not on file  Social History Narrative  . Not on file   Social Determinants of Health   Financial Resource Strain: Not on file  Food Insecurity: Not on file  Transportation Needs: Not on file  Physical Activity: Not on file  Stress: Not on file  Social Connections: Not on file  Intimate Partner Violence: Not on file     Constitutional: Denies fever, malaise, fatigue, headache or abrupt weight changes.  HEENT: Denies eye pain, eye redness, ear pain, ringing in the ears, wax buildup, runny nose, nasal congestion, bloody nose, or sore throat. Respiratory: Denies difficulty breathing, shortness of breath, cough or sputum production.   Cardiovascular: Denies chest pain, chest tightness, palpitations or swelling in the hands or feet.  Gastrointestinal: Denies abdominal pain, bloating, constipation, diarrhea or blood in the stool.  GU: Denies urgency, frequency, pain with urination, burning sensation, blood in urine, odor or discharge. Musculoskeletal: Denies decrease in range of motion,  difficulty with gait, muscle pain or joint pain and swelling.  Skin: She reports a skin lesion to her midline back. Denies redness, rashes, or ulcercations.  Neurological: Denies dizziness, difficulty with memory, difficulty with speech or problems with balance and coordination.  Psych: Denies anxiety, depression, SI/HI.  No other specific complaints in a complete review of systems (except as listed in HPI above).  Objective:   Physical Exam   BP 120/85 (BP Location: Right Arm, Patient Position: Sitting, Cuff Size: Normal)   Pulse 70   Temp (!) 97.1 F (36.2 C) (Temporal)   Ht _0  (1.651 m)   Wt 142 lb  (64.4 kg)   SpO2 100%   BMI 23.63 kg/m  Wt Readings from Last 3 Encounters:  05/09/21 142 lb (64.4 kg)  08/11/19 168 lb (76.2 kg)  12/23/18 170 lb (77.1 kg)    General: Appears her stated age, well developed, well nourished in NAD. Skin: Warm, dry and intact. Seborrheic keratosis noted of midline back at the bra line. HEENT: Head: normal shape and size; Eyes: sclera white and EOMs intact;  Neck:  Neck supple, trachea midline. No masses, lumps or thyromegaly present.  Cardiovascular: Normal rate and rhythm. S1,S2 noted.  No murmur, rubs or gallops noted. No JVD or BLE edema. No carotid bruits noted. Pulmonary/Chest: Normal effort and positive vesicular breath sounds. No respiratory distress. No wheezes, rales or ronchi noted.  Abdomen: Soft and nontender. Normal bowel sounds. No distention or masses noted. Liver, spleen and kidneys non palpable. Musculoskeletal: Strength 5/5 BUE/BLE. No difficulty with gait.  Neurological: Alert and oriented. Cranial nerves II-XII grossly intact. Coordination normal.  Psychiatric: Mood and affect normal. Behavior is normal. Judgment and thought content normal.     BMET    Component Value Date/Time   NA 139 08/11/2019 1113   K 4.1 08/11/2019 1113   CL 103 08/11/2019 1113   CO2 28 08/11/2019 1113   GLUCOSE 98 08/11/2019 1113   BUN 9 08/11/2019 1113   CREATININE 0.71 08/11/2019 1113   CALCIUM 9.4 08/11/2019 1113    Lipid Panel     Component Value Date/Time   CHOL 219 (H) 08/11/2019 1113   TRIG 123.0 08/11/2019 1113   HDL 61.30 08/11/2019 1113   CHOLHDL 4 08/11/2019 1113   VLDL 24.6 08/11/2019 1113   LDLCALC 133 (H) 08/11/2019 1113    CBC    Component Value Date/Time   WBC 5.5 08/11/2019 1113   RBC 4.58 08/11/2019 1113   HGB 13.6 08/11/2019 1113   HCT 40.8 08/11/2019 1113   PLT 223.0 08/11/2019 1113   MCV 89.2 08/11/2019 1113   MCHC 33.3 08/11/2019 1113   RDW 13.5 08/11/2019 1113    Hgb A1C No results found for:  HGBA1C        Assessment & Plan:   Preventative health maintenance:  Encouraged her to get a flu shot in fall Tetanus UTD COVID UTD, she will attach a copy of her card to mychart She no longer ordered- she will call to schedule Bone density ordered- she will call to schedule Colon screening UTD Encouraged her to consume a balanced diet and exercise regimen Advised her to see an eye doctor and dentist annually We will check CBC, c-Met, TSH, lipid profile today  RTC in 1 year, sooner if needed Kizzie Furnish, CMA This visit occurred during the SARS-CoV-2 public health emergency.  Safety protocols were in place, including screening questions prior to the visit, additional usage of staff PPE,  and extensive cleaning of exam room while observing appropriate contact time as indicated for disinfecting solutions.

## 2021-05-10 LAB — COMPREHENSIVE METABOLIC PANEL
AG Ratio: 1.8 (calc) (ref 1.0–2.5)
ALT: 16 U/L (ref 6–29)
AST: 17 U/L (ref 10–35)
Albumin: 4.5 g/dL (ref 3.6–5.1)
Alkaline phosphatase (APISO): 56 U/L (ref 37–153)
BUN: 11 mg/dL (ref 7–25)
CO2: 30 mmol/L (ref 20–32)
Calcium: 9.7 mg/dL (ref 8.6–10.4)
Chloride: 102 mmol/L (ref 98–110)
Creat: 0.57 mg/dL (ref 0.50–1.05)
Globulin: 2.5 g/dL (calc) (ref 1.9–3.7)
Glucose, Bld: 89 mg/dL (ref 65–99)
Potassium: 3.8 mmol/L (ref 3.5–5.3)
Sodium: 140 mmol/L (ref 135–146)
Total Bilirubin: 0.6 mg/dL (ref 0.2–1.2)
Total Protein: 7 g/dL (ref 6.1–8.1)

## 2021-05-10 LAB — LIPID PANEL
Cholesterol: 217 mg/dL — ABNORMAL HIGH (ref <200)
HDL: 74 mg/dL (ref >=50)
LDL Cholesterol (Calc): 119 mg/dL (calc) — ABNORMAL HIGH
Non-HDL Cholesterol (Calc): 143 mg/dL (calc) — ABNORMAL HIGH (ref <130)
Total CHOL/HDL Ratio: 2.9 (calc) (ref <5.0)
Triglycerides: 127 mg/dL (ref <150)

## 2021-05-10 LAB — CBC
Hemoglobin: 13.9 g/dL (ref 11.7–15.5)
MCH: 30.5 pg (ref 27.0–33.0)
MPV: 11.3 fL (ref 7.5–12.5)
Platelets: 193 10*3/uL (ref 140–400)
RBC: 4.55 10*6/uL (ref 3.80–5.10)
RDW: 12.6 % (ref 11.0–15.0)
WBC: 4.5 10*3/uL (ref 3.8–10.8)

## 2021-05-10 LAB — TSH: TSH: 2.21 mIU/L (ref 0.40–4.50)

## 2021-05-24 NOTE — Addendum Note (Signed)
Addended by: Lorre Munroe on: 05/24/2021 12:06 PM   Modules accepted: Level of Service

## 2021-07-28 ENCOUNTER — Ambulatory Visit
Admission: RE | Admit: 2021-07-28 | Discharge: 2021-07-28 | Disposition: A | Discharge status: Home / Self Care | Payer: 59 | Source: Ambulatory Visit | Attending: Internal Medicine | Admitting: Internal Medicine

## 2021-07-28 ENCOUNTER — Other Ambulatory Visit: Payer: Self-pay

## 2021-07-28 DIAGNOSIS — Z1231 Encounter for screening mammogram for malignant neoplasm of breast: Secondary | ICD-10-CM

## 2021-10-31 ENCOUNTER — Other Ambulatory Visit: Payer: Self-pay

## 2021-10-31 ENCOUNTER — Ambulatory Visit
Admission: RE | Admit: 2021-10-31 | Discharge: 2021-10-31 | Disposition: A | Discharge status: Home / Self Care | Payer: 59 | Source: Ambulatory Visit | Attending: Internal Medicine | Admitting: Internal Medicine

## 2021-10-31 DIAGNOSIS — M8588 Other specified disorders of bone density and structure, other site: Secondary | ICD-10-CM | POA: Diagnosis not present

## 2021-10-31 DIAGNOSIS — Z780 Asymptomatic menopausal state: Secondary | ICD-10-CM

## 2022-02-05 IMAGING — MG MM DIGITAL SCREENING BILAT W/ TOMO AND CAD
8 series · 9 of 24 positions shown · non-contrast
Comparison: Previous exam(s).

CLINICAL DATA: Screening.

EXAM:
DIGITAL SCREENING BILATERAL MAMMOGRAM WITH TOMOSYNTHESIS AND CAD
TECHNIQUE: Bilateral screening digital craniocaudal and mediolateral oblique
mammograms were obtained. Bilateral screening digital breast
tomosynthesis was performed. The images were evaluated with
computer-aided detection.

[L MLO synth-2D]
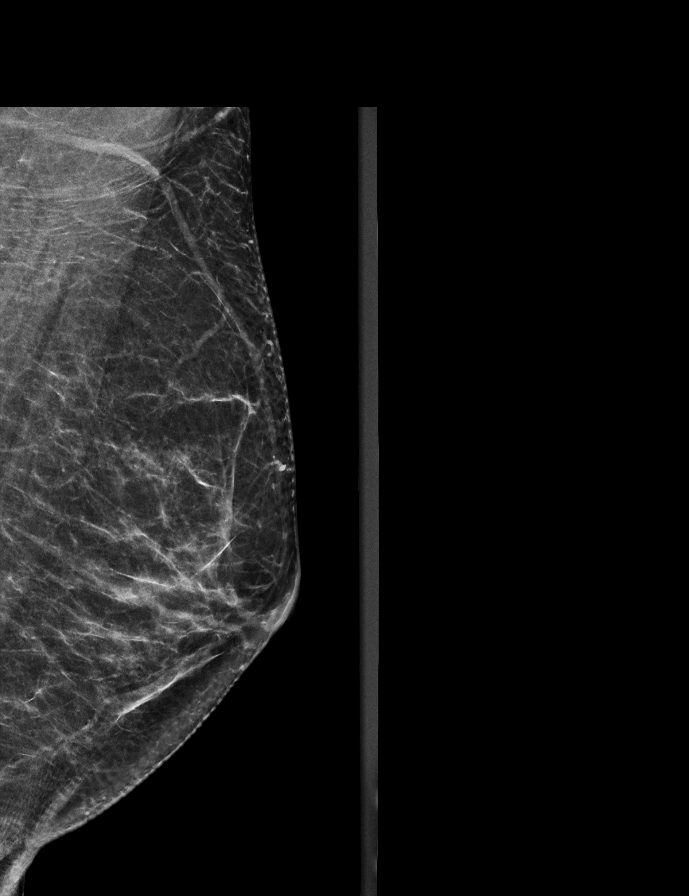

[R MLO synth-2D]
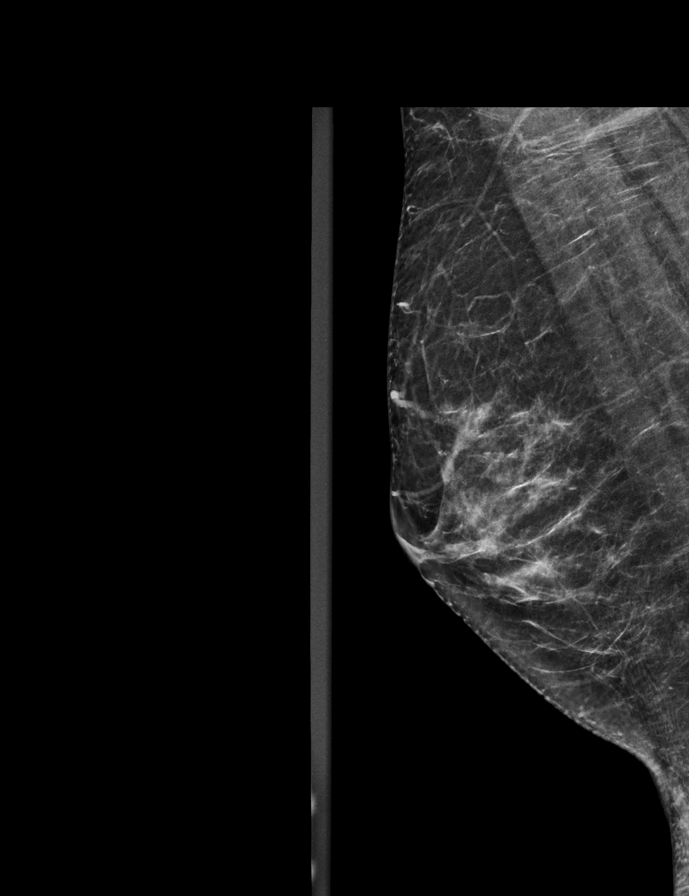

[R CC synth-2D]
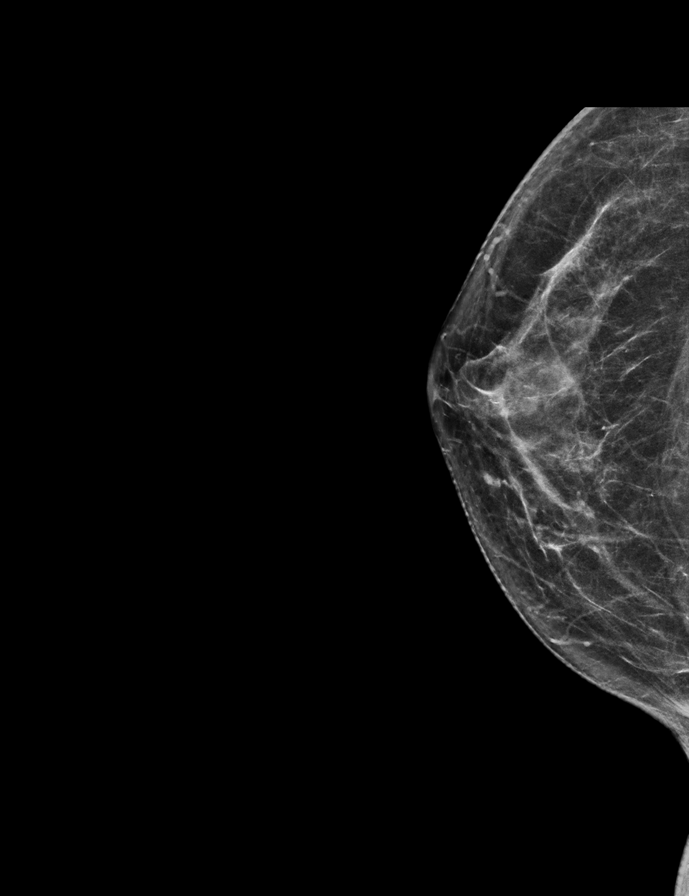

[L CC synth-2D]
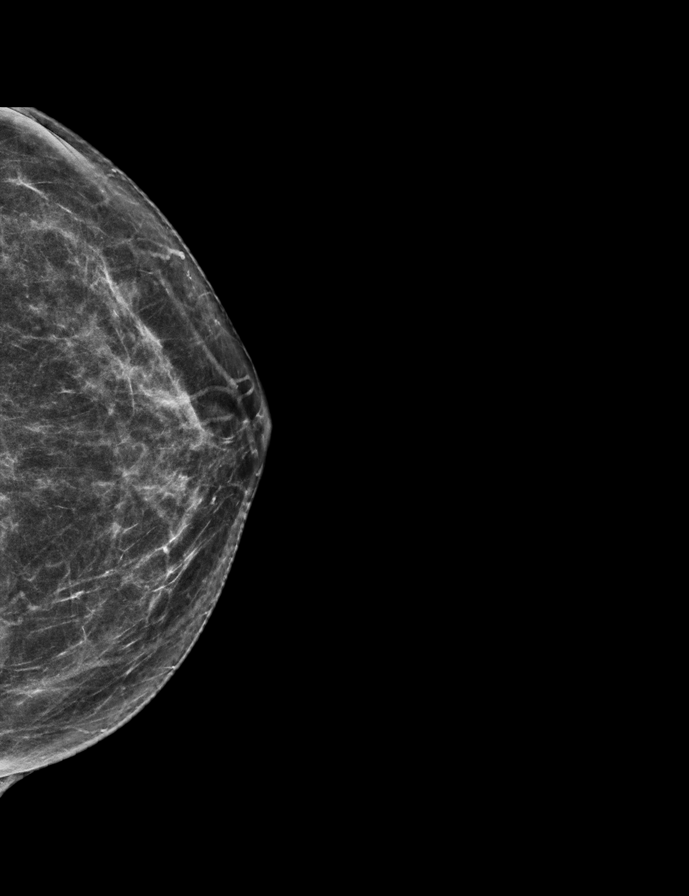

[L MLO tomo · 2 of 59 frames shown]
[frame 20/59]
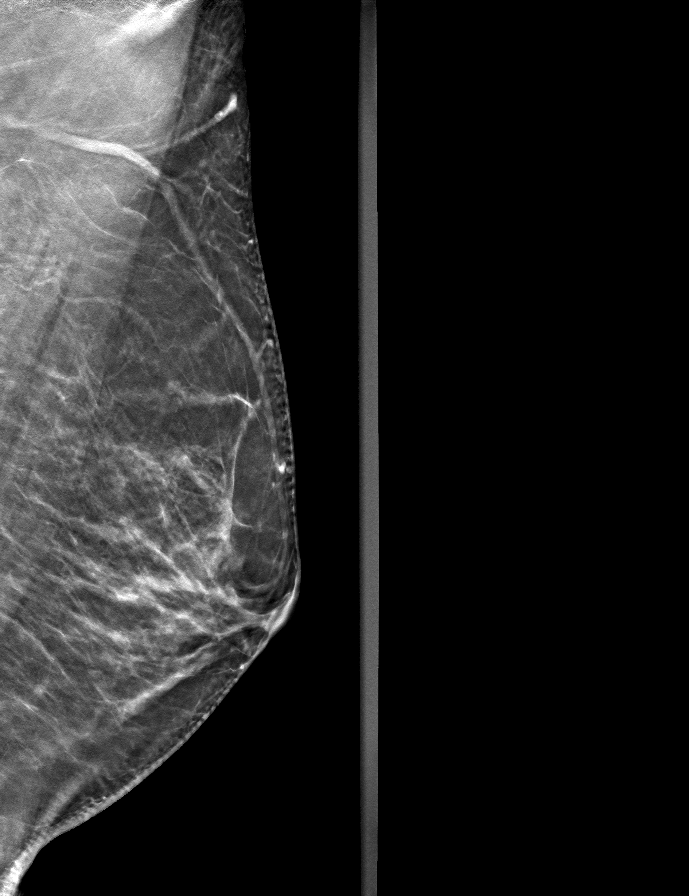
[frame 30/59]
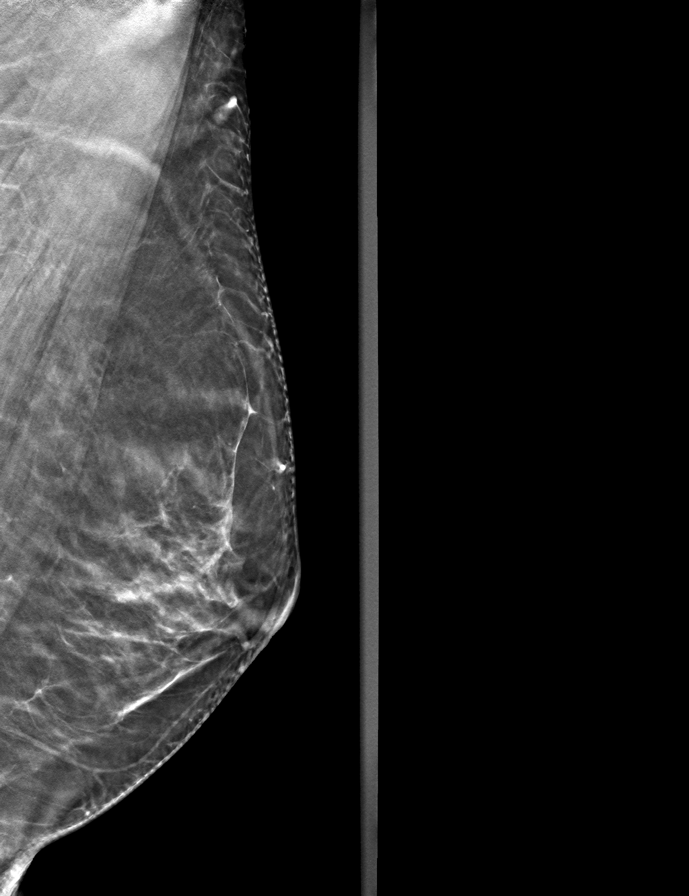

[R CC tomo · tomo slice 31/60.0]
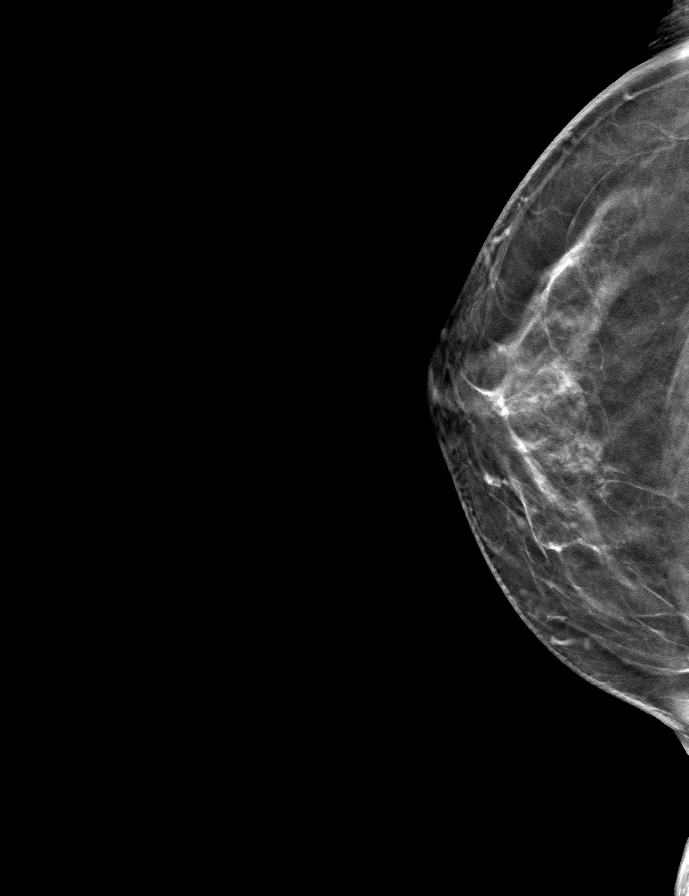

[R MLO tomo · tomo slice 29/56.0]
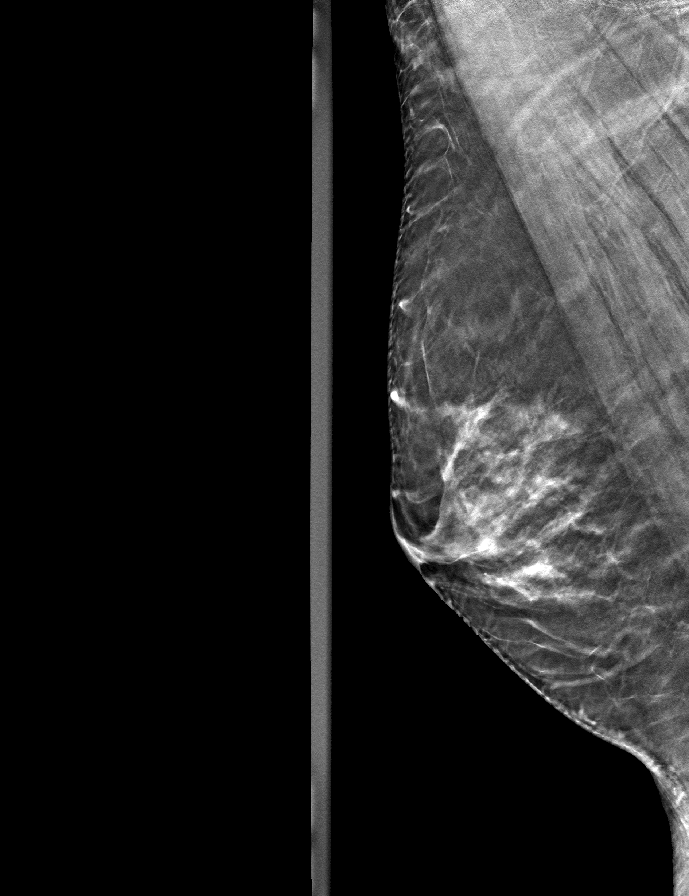

[L CC tomo · tomo slice 31/60.0]
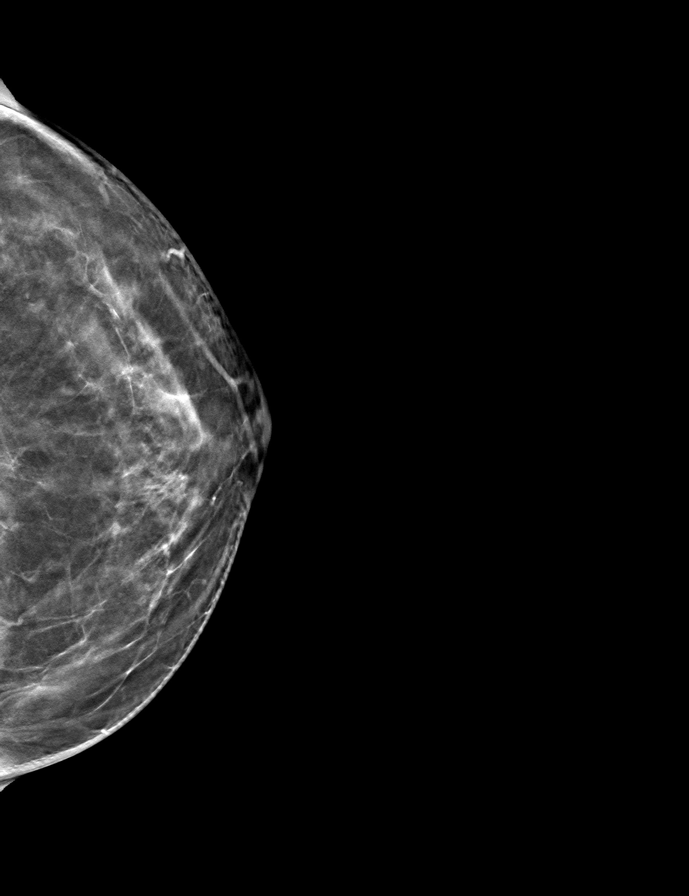

[9 of 24 positions shown; findings below may reference images not displayed]

ACR Breast Density Category b: There are scattered areas of
fibroglandular density.
FINDINGS: There are no findings suspicious for malignancy.
IMPRESSION: No mammographic evidence of malignancy. A result letter of this
screening mammogram will be mailed directly to the patient.

RECOMMENDATION:
Screening mammogram in one year. (Code:51-O-LD2)

BI-RADS CATEGORY  1: Negative.

## 2023-06-04 DIAGNOSIS — R220 Localized swelling, mass and lump, head: Secondary | ICD-10-CM | POA: Diagnosis not present

## 2023-06-12 DIAGNOSIS — Z13220 Encounter for screening for lipoid disorders: Secondary | ICD-10-CM | POA: Diagnosis not present

## 2024-04-24 ENCOUNTER — Other Ambulatory Visit: Payer: Self-pay | Admitting: Family Medicine

## 2024-04-24 DIAGNOSIS — Z1231 Encounter for screening mammogram for malignant neoplasm of breast: Secondary | ICD-10-CM

## 2024-04-29 ENCOUNTER — Ambulatory Visit
Admission: RE | Admit: 2024-04-29 | Discharge: 2024-04-29 | Disposition: A | Discharge status: Home / Self Care | Source: Ambulatory Visit | Attending: Family Medicine | Admitting: Family Medicine

## 2024-04-29 DIAGNOSIS — Z1231 Encounter for screening mammogram for malignant neoplasm of breast: Secondary | ICD-10-CM

## 2024-08-06 DIAGNOSIS — D2261 Melanocytic nevi of right upper limb, including shoulder: Secondary | ICD-10-CM | POA: Diagnosis not present

## 2024-08-06 DIAGNOSIS — D2272 Melanocytic nevi of left lower limb, including hip: Secondary | ICD-10-CM | POA: Diagnosis not present

## 2024-08-06 DIAGNOSIS — D485 Neoplasm of uncertain behavior of skin: Secondary | ICD-10-CM | POA: Diagnosis not present

## 2024-08-06 DIAGNOSIS — D225 Melanocytic nevi of trunk: Secondary | ICD-10-CM | POA: Diagnosis not present

## 2024-08-06 DIAGNOSIS — D2262 Melanocytic nevi of left upper limb, including shoulder: Secondary | ICD-10-CM | POA: Diagnosis not present

## 2024-08-06 DIAGNOSIS — L821 Other seborrheic keratosis: Secondary | ICD-10-CM | POA: Diagnosis not present

## 2024-08-06 DIAGNOSIS — L578 Other skin changes due to chronic exposure to nonionizing radiation: Secondary | ICD-10-CM | POA: Diagnosis not present

## 2024-08-06 DIAGNOSIS — D2271 Melanocytic nevi of right lower limb, including hip: Secondary | ICD-10-CM | POA: Diagnosis not present

## 2024-10-21 ENCOUNTER — Other Ambulatory Visit: Payer: Self-pay

## 2024-10-21 MED ORDER — FLUZONE 0.5 ML IM SUSY
0.5000 mL | PREFILLED_SYRINGE | Freq: Once | INTRAMUSCULAR | 0 refills | Status: AC
  Filled 2024-10-21: qty 0.5, 1d supply, fill #0
